# Patient Record
Sex: Female | Born: 1965
Health system: Southern US, Community
[De-identification: ages and names within clinical notes are randomized; demographics above are authoritative.]

## PROBLEM LIST (undated history)

## (undated) DIAGNOSIS — E7800 Pure hypercholesterolemia, unspecified: Secondary | ICD-10-CM

## (undated) DIAGNOSIS — I10 Essential (primary) hypertension: Secondary | ICD-10-CM

## (undated) DIAGNOSIS — E119 Type 2 diabetes mellitus without complications: Secondary | ICD-10-CM

## (undated) HISTORY — PX: TUBAL LIGATION: SHX77

## (undated) HISTORY — DX: Essential (primary) hypertension: I10

## (undated) HISTORY — PX: GALLBLADDER SURGERY: SHX652

---

## 1998-05-06 ENCOUNTER — Other Ambulatory Visit: Admission: RE | Admit: 1998-05-06 | Discharge: 1998-05-06 | Payer: Self-pay | Admitting: Obstetrics and Gynecology

## 1998-05-19 ENCOUNTER — Observation Stay (HOSPITAL_COMMUNITY): Admission: AD | Admit: 1998-05-19 | Discharge: 1998-05-20 | Payer: Self-pay | Admitting: Obstetrics and Gynecology

## 1998-06-20 ENCOUNTER — Encounter: Admission: RE | Admit: 1998-06-20 | Discharge: 1998-09-18 | Payer: Self-pay | Admitting: Obstetrics and Gynecology

## 1998-08-08 ENCOUNTER — Inpatient Hospital Stay (HOSPITAL_COMMUNITY): Admission: AD | Admit: 1998-08-08 | Discharge: 1998-08-08 | Payer: Self-pay | Admitting: Obstetrics

## 1998-08-28 ENCOUNTER — Inpatient Hospital Stay (HOSPITAL_COMMUNITY): Admission: AD | Admit: 1998-08-28 | Discharge: 1998-08-31 | Payer: Self-pay | Admitting: Obstetrics and Gynecology

## 1998-08-31 ENCOUNTER — Encounter (HOSPITAL_COMMUNITY): Admission: RE | Admit: 1998-08-31 | Discharge: 1998-11-29 | Payer: Self-pay | Admitting: Obstetrics and Gynecology

## 1998-12-03 ENCOUNTER — Encounter (HOSPITAL_COMMUNITY): Admission: RE | Admit: 1998-12-03 | Discharge: 1999-03-03 | Payer: Self-pay | Admitting: Obstetrics and Gynecology

## 1999-03-06 ENCOUNTER — Encounter (HOSPITAL_COMMUNITY): Admission: RE | Admit: 1999-03-06 | Discharge: 1999-06-04 | Payer: Self-pay | Admitting: Obstetrics and Gynecology

## 1999-04-30 ENCOUNTER — Encounter: Payer: Self-pay | Admitting: Emergency Medicine

## 1999-04-30 ENCOUNTER — Emergency Department (HOSPITAL_COMMUNITY): Admission: EM | Admit: 1999-04-30 | Discharge: 1999-05-01 | Payer: Self-pay | Admitting: Emergency Medicine

## 1999-06-06 ENCOUNTER — Encounter: Admission: RE | Admit: 1999-06-06 | Discharge: 1999-09-04 | Payer: Self-pay | Admitting: Obstetrics and Gynecology

## 1999-10-04 ENCOUNTER — Encounter: Admission: RE | Admit: 1999-10-04 | Discharge: 2000-01-02 | Payer: Self-pay | Admitting: Obstetrics and Gynecology

## 2000-01-04 ENCOUNTER — Encounter: Admission: RE | Admit: 2000-01-04 | Discharge: 2000-04-03 | Payer: Self-pay | Admitting: Obstetrics and Gynecology

## 2000-04-04 ENCOUNTER — Encounter: Admission: RE | Admit: 2000-04-04 | Discharge: 2000-05-05 | Payer: Self-pay | Admitting: Obstetrics and Gynecology

## 2014-10-03 ENCOUNTER — Other Ambulatory Visit: Payer: Self-pay | Admitting: Physical Medicine and Rehabilitation

## 2014-10-03 DIAGNOSIS — M5416 Radiculopathy, lumbar region: Secondary | ICD-10-CM

## 2014-10-09 ENCOUNTER — Ambulatory Visit
Admission: RE | Admit: 2014-10-09 | Discharge: 2014-10-09 | Disposition: A | Discharge status: Home / Self Care | Payer: No Typology Code available for payment source | Source: Ambulatory Visit | Attending: Physical Medicine and Rehabilitation | Admitting: Physical Medicine and Rehabilitation

## 2014-10-09 DIAGNOSIS — M2578 Osteophyte, vertebrae: Secondary | ICD-10-CM | POA: Diagnosis not present

## 2014-10-09 DIAGNOSIS — M79604 Pain in right leg: Secondary | ICD-10-CM | POA: Diagnosis present

## 2014-10-09 DIAGNOSIS — M47896 Other spondylosis, lumbar region: Secondary | ICD-10-CM | POA: Diagnosis not present

## 2014-10-09 DIAGNOSIS — M5136 Other intervertebral disc degeneration, lumbar region: Secondary | ICD-10-CM | POA: Insufficient documentation

## 2014-10-09 DIAGNOSIS — M5416 Radiculopathy, lumbar region: Secondary | ICD-10-CM

## 2014-10-09 DIAGNOSIS — M2548 Effusion, other site: Secondary | ICD-10-CM | POA: Insufficient documentation

## 2014-10-09 DIAGNOSIS — M79605 Pain in left leg: Secondary | ICD-10-CM | POA: Diagnosis present

## 2014-10-09 DIAGNOSIS — M545 Low back pain: Secondary | ICD-10-CM | POA: Diagnosis present

## 2014-11-04 DIAGNOSIS — M5116 Intervertebral disc disorders with radiculopathy, lumbar region: Secondary | ICD-10-CM | POA: Insufficient documentation

## 2014-11-04 DIAGNOSIS — M5136 Other intervertebral disc degeneration, lumbar region: Secondary | ICD-10-CM | POA: Insufficient documentation

## 2014-11-04 DIAGNOSIS — M7060 Trochanteric bursitis, unspecified hip: Secondary | ICD-10-CM | POA: Insufficient documentation

## 2014-11-07 ENCOUNTER — Encounter: Payer: Self-pay | Admitting: Family Medicine

## 2014-11-07 ENCOUNTER — Ambulatory Visit (INDEPENDENT_AMBULATORY_CARE_PROVIDER_SITE_OTHER): Payer: No Typology Code available for payment source | Admitting: Family Medicine

## 2014-11-07 VITALS — BP 110/76 | HR 112 | Resp 17 | Ht 63.0 in | Wt 249.6 lb

## 2014-11-07 DIAGNOSIS — E1122 Type 2 diabetes mellitus with diabetic chronic kidney disease: Secondary | ICD-10-CM | POA: Diagnosis not present

## 2014-11-07 DIAGNOSIS — F320 Major depressive disorder, single episode, mild: Secondary | ICD-10-CM | POA: Insufficient documentation

## 2014-11-07 DIAGNOSIS — G8929 Other chronic pain: Secondary | ICD-10-CM | POA: Insufficient documentation

## 2014-11-07 DIAGNOSIS — N924 Excessive bleeding in the premenopausal period: Secondary | ICD-10-CM

## 2014-11-07 DIAGNOSIS — N183 Chronic kidney disease, stage 3 unspecified: Secondary | ICD-10-CM | POA: Insufficient documentation

## 2014-11-07 DIAGNOSIS — I10 Essential (primary) hypertension: Secondary | ICD-10-CM | POA: Insufficient documentation

## 2014-11-07 DIAGNOSIS — E119 Type 2 diabetes mellitus without complications: Secondary | ICD-10-CM

## 2014-11-07 DIAGNOSIS — F329 Major depressive disorder, single episode, unspecified: Secondary | ICD-10-CM | POA: Diagnosis not present

## 2014-11-07 DIAGNOSIS — E1169 Type 2 diabetes mellitus with other specified complication: Secondary | ICD-10-CM | POA: Insufficient documentation

## 2014-11-07 DIAGNOSIS — M549 Dorsalgia, unspecified: Secondary | ICD-10-CM

## 2014-11-07 DIAGNOSIS — F32A Depression, unspecified: Secondary | ICD-10-CM | POA: Insufficient documentation

## 2014-11-07 MED ORDER — TRANEXAMIC ACID 650 MG PO TABS: 1300.0000 mg | ORAL_TABLET | Freq: Two times a day (BID) | ORAL | Status: DC

## 2014-11-07 NOTE — Assessment & Plan Note (Signed)
Check CMP. Pt cautioned to avoid NSAIDs. Maintain hydration. Consider titration off metformin if GFR < 45.

## 2014-11-07 NOTE — Progress Notes (Signed)
Subjective:    Patient ID: Barbara Bird, female    DOB: [birth year], 49 y.o.   MRN: [id number]  HPI: Barbara Bird is a 49 y.o. female presenting on 11/07/2014 for Establish Care and Back Pain   HPI  Pt presents to establish care today. Previous PCP was with [facility]. Sees Dr. Rich Brave with [facility] for back pain. Sees Dr. Levi Aland for orthopedics.  Current medical problems: HTN: Diagnosed >10 years ago. Currently Lisinopril-HCTZ; does not check BP at home. No HA, visual changes, or dizziness. Chronic stage 3: Never seen kidney specialist.  Type 2 DM: Diagnosed > 10 years ago. Last A1c- 8.9% in April. Does not check sugars at home. Taking Metformin  twice daily. Tries to stick with diabetic diet.  Degenerative spine disease: Orthopedics [facility] Spine specialist. In lots of pain. Started on gabapentin  BID- working well  Has been working at [employer] to help stretch her back. Was referred to pain specialist but cannot afford due to insurance costs.  Heavy/ long period: LMP start ~10/16/2014- heavy for 3-4 days: Normal periods are 6-7 days. This periods ~21 days- intermittently heavy but mostly spotting. Brownish drainage. No clots. Pt had tubal ligation in the past. Never had a period like this in the past. Unsure if Mom went through menopause.    Past Medical History  Diagnosis Date  . Hypertension   . Chronic kidney disease    History   Social History  . Marital Status: Married    Spouse Name: N/A  . Number of Children: N/A  . Years of Education: N/A   Occupational History  . Not on file.   Social History Main Topics  . Smoking status: Never Smoker   . Smokeless tobacco: Never Used  . Alcohol Use: Yes     Comment: occasional  . Drug Use: No  . Sexual Activity: Yes    Birth Control/ Protection: Surgical   Other Topics Concern  . Not on file   Social History Narrative  . No narrative on file   Family History  Problem Relation Age of Onset   . Stroke Father 33  . Diabetes Father   . Cancer Maternal Aunt     [employer]  . Mental illness Maternal Grandmother    No current outpatient prescriptions on file prior to visit.   No current facility-administered medications on file prior to visit.    Review of Systems  Constitutional: Negative for fever and chills.  HENT: Negative.   Respiratory: Negative for cough, chest tightness and wheezing.   Cardiovascular: Negative for chest pain, palpitations and leg swelling.  Gastrointestinal: Negative for nausea, diarrhea and abdominal distention.  Endocrine: Negative for cold intolerance, heat intolerance, polydipsia, polyphagia and polyuria.  Genitourinary: Positive for menstrual problem (increased length of menstrual period). Negative for vaginal bleeding, vaginal discharge, difficulty urinating, vaginal pain and dyspareunia.  Musculoskeletal: Positive for back pain and arthralgias.  Neurological: Positive for numbness.  Hematological: Negative.   Psychiatric/Behavioral: Positive for dysphoric mood. Negative for suicidal ideas.   Per HPI unless specifically indicated above     Depression screen [facility] 2/9 11/07/2014 11/07/2014  Decreased Interest 2 0  Down, Depressed, Hopeless 3 0  PHQ - 2 Score 5 0  Altered sleeping 2 -  Tired, decreased energy 1 -  Change in appetite 2 -  Feeling bad or failure about yourself  2 -  Trouble concentrating 0 -  Moving slowly or fidgety/restless 0 -  Suicidal thoughts  0 -  PHQ-9 Score 12 -  Difficult doing work/chores Very difficult -     Objective:    BP 110/76 mmHg  Pulse 112  Resp 17  Ht 5\' 3"  (1.6 m)  Wt 249 lb 9.6 oz (113.218 kg)  BMI 44.23 kg/m2  LMP 10/17/2014  Wt Readings from Last 3 Encounters:  11/07/14 249 lb 9.6 oz (113.218 kg)    Physical Exam  Constitutional: She is oriented to person, place, and time. She appears well-developed and well-nourished. No distress.  HENT:  Head: Normocephalic and atraumatic.  Neck: Neck  supple.  Cardiovascular: Normal rate, regular rhythm and normal heart sounds.  Exam reveals no gallop and no friction rub.   No murmur heard. Pulmonary/Chest: Effort normal and breath sounds normal. She has no wheezes. She exhibits no tenderness.  Abdominal: Soft. Normal appearance and bowel sounds are normal. She exhibits no distension and no mass. There is no tenderness. There is no rebound and no guarding.  Genitourinary: Vagina normal and uterus normal. There is no tenderness or lesion on the right labia. There is no tenderness or lesion on the left labia. Cervix exhibits discharge (Spotting noted on exam. Bloodtinged/brown discharge noted. ). Right adnexum displays no mass and no tenderness. Left adnexum displays no mass and no tenderness. No vaginal discharge found.  Musculoskeletal: Normal range of motion. She exhibits no edema or tenderness.  Lymphadenopathy:    She has no cervical adenopathy.  Neurological: She is alert and oriented to person, place, and time.  Skin: Skin is warm and dry. She is not diaphoretic.  Psychiatric: Her speech is normal. Judgment and thought content normal. She is agitated (tearful throughout interview. ). Cognition and memory are normal. She exhibits a depressed mood.   No results found for this or any previous visit.    Assessment & Plan:   Problem List Items Addressed This Visit      Cardiovascular and Mediastinum   BP (high blood pressure)    Controlled today. Check CMP. Continue current medication regimen.      Relevant Medications   lisinopril-hydrochlorothiazide (PRINZIDE,ZESTORETIC) 20-12.5 MG per tablet   tranexamic acid (LYSTEDA) 650 MG TABS tablet     Endocrine   Diabetes mellitus, type 2 - Primary    Pt declined POCT HgA1c. Check with lab work. Last A1c was 8.9% per [facility].  Continue current dosing of metformin. Consider adding additional oral medications pending A1c and kidney function.       Relevant Medications    lisinopril-hydrochlorothiazide (PRINZIDE,ZESTORETIC) 20-12.5 MG per tablet   metFORMIN (GLUCOPHAGE) 500 MG tablet   Other Relevant Orders   Comprehensive Metabolic Panel (CMET)   Lipid Profile   HgB A1c     Genitourinary   CKD stage 3 due to type 2 diabetes mellitus    Check CMP. Pt cautioned to avoid NSAIDs. Maintain hydration. Consider titration off metformin if GFR < 45.       Relevant Medications   lisinopril-hydrochlorothiazide (PRINZIDE,ZESTORETIC) 20-12.5 MG per tablet   metFORMIN (GLUCOPHAGE) 500 MG tablet     Other   Mild depression    PHQ-9 12/27. Pt attributed to pain. Discussed adding cymbalta. Pt would like to discuss with her spine specialist.        Other Visit Diagnoses    Excessive bleeding in premenopausal period        Check Hcg, CBC, and TSH. No mass or lesion on pelvic. Suspect perimenopausal AUB. Trial of  low dose lysteda. If continues referral to  GYN for endometrial biops    Relevant Medications    tranexamic acid (LYSTEDA) 650 MG TABS tablet    Other Relevant Orders    CBC with Differential    hCG, serum, qualitative    TSH       Meds ordered this encounter  Medications  . DISCONTD: carisoprodol (SOMA) 350 MG tablet    Sig:   . DISCONTD: cyclobenzaprine (FLEXERIL) 5 MG tablet    Sig:   . gabapentin (NEURONTIN) 300 MG capsule    Sig:   . lisinopril-hydrochlorothiazide (PRINZIDE,ZESTORETIC) 20-12.5 MG per tablet    Sig:   . DISCONTD: traMADol (ULTRAM) 50 MG tablet    Sig:   . DISCONTD: valACYclovir (VALTREX) 1000 MG tablet    Sig:   . Menthol, Topical Analgesic, 10 % LIQD    Sig:   . metFORMIN (GLUCOPHAGE) 500 MG tablet    Sig: Take by mouth.  . DISCONTD: carisoprodol (SOMA) 350 MG tablet    Sig: Take by mouth.  . DISCONTD: lisinopril-hydrochlorothiazide (PRINZIDE,ZESTORETIC) 20-12.5 MG per tablet    Sig: Take by mouth.  . DISCONTD: valACYclovir (VALTREX) 1000 MG tablet    Sig: Take by mouth.  . Carisoprodol-Aspirin (SOMA COMPOUND PO)     Sig: Take by mouth as needed.  . Nerve Stimulator (STANDARD TENS) DEVI    Sig: by Does not apply route.  . tranexamic acid (LYSTEDA) 650 MG TABS tablet    Sig: Take 2 tablets (1,300 mg total) by mouth 2 (two) times daily.    Dispense:  10 tablet    Refill:  0    Order Specific Question:  Supervising Provider    Answer:  Janeann Forehand [[id number]]      Follow up plan: Return in about 1 week (around 11/14/2014), or if symptoms worsen or fail to improve.

## 2014-11-07 NOTE — Assessment & Plan Note (Signed)
PHQ-9 12/27. Pt attributed to pain. Discussed adding cymbalta. Pt would like to discuss with her spine specialist.

## 2014-11-07 NOTE — Assessment & Plan Note (Signed)
Pt declined POCT HgA1c. Check with lab work. Last A1c was 8.9% per [facility].  Continue current dosing of metformin. Consider adding additional oral medications pending A1c and kidney function.

## 2014-11-07 NOTE — Assessment & Plan Note (Signed)
Controlled today. Check CMP. Continue current medication regimen.

## 2014-11-07 NOTE — Patient Instructions (Signed)
Abnormal Uterine Bleeding Abnormal uterine bleeding can affect women at various stages in life, including teenagers, women in their reproductive years, pregnant women, and women who have reached menopause. Several kinds of uterine bleeding are considered abnormal, including:  Bleeding or spotting between periods.   Bleeding after sexual intercourse.   Bleeding that is heavier or more than normal.   Periods that last longer than usual.  Bleeding after menopause.  Many cases of abnormal uterine bleeding are minor and simple to treat, while others are more serious. Any type of abnormal bleeding should be evaluated by your health care provider. Treatment will depend on the cause of the bleeding. HOME CARE INSTRUCTIONS Monitor your condition for any changes. The following actions may help to alleviate any discomfort you are experiencing:  Avoid the use of tampons and douches as directed by your health care provider.  Change your pads frequently. You should get regular pelvic exams and Pap tests. Keep all follow-up appointments for diagnostic tests as directed by your health care provider.  SEEK MEDICAL CARE IF:   Your bleeding lasts more than 1 week.   You feel dizzy at times.  SEEK IMMEDIATE MEDICAL CARE IF:   You pass out.   You are changing pads every 15 to 30 minutes.   You have abdominal pain.  You have a fever.   You become sweaty or weak.   You are passing large blood clots from the vagina.   You start to feel nauseous and vomit. MAKE SURE YOU:   Understand these instructions.  Will watch your condition.  Will get help right away if you are not doing well or get worse. Document Released: 03/29/2005 Document Revised: 04/03/2013 Document Reviewed: 10/26/2012 ExitCare Patient Information 2015 [employer]. This information is not intended to replace advice given to you by your health care provider. Make sure you discuss any questions you have with your  health care provider.  

## 2014-11-08 LAB — CBC WITH DIFFERENTIAL/PLATELET
Basophils Absolute: 0 10*3/uL (ref 0.0–0.2)
Basos: 0 %
EOS (ABSOLUTE): 0.2 10*3/uL (ref 0.0–0.4)
Eos: 1 %
Hematocrit: 40.5 % (ref 34.0–46.6)
Hemoglobin: 14.1 g/dL (ref 11.1–15.9)
IMMATURE GRANS (ABS): 0 10*3/uL (ref 0.0–0.1)
Immature Granulocytes: 0 %
Lymphocytes Absolute: 2.6 10*3/uL (ref 0.7–3.1)
Lymphs: 25 %
MCH: 31.3 pg (ref 26.6–33.0)
MCHC: 34.8 g/dL (ref 31.5–35.7)
MCV: 90 fL (ref 79–97)
Monocytes Absolute: 0.7 10*3/uL (ref 0.1–0.9)
Monocytes: 7 %
NEUTROS PCT: 67 %
Neutrophils Absolute: 7 10*3/uL (ref 1.4–7.0)
PLATELETS: 367 10*3/uL (ref 150–379)
RBC: 4.51 x10E6/uL (ref 3.77–5.28)
RDW: 13.8 % (ref 12.3–15.4)
WBC: 10.5 10*3/uL (ref 3.4–10.8)

## 2014-11-08 LAB — HEMOGLOBIN A1C
ESTIMATED AVERAGE GLUCOSE: 151 mg/dL
Hgb A1c MFr Bld: 6.9 % — ABNORMAL HIGH (ref 4.8–5.6)

## 2014-11-09 LAB — COMPREHENSIVE METABOLIC PANEL
ALT: 19 IU/L (ref 0–32)
AST: 12 IU/L (ref 0–40)
Albumin/Globulin Ratio: 2 (ref 1.1–2.5)
Albumin: 4.5 g/dL (ref 3.5–5.5)
Alkaline Phosphatase: 75 IU/L (ref 39–117)
BUN/Creatinine Ratio: 27 — ABNORMAL HIGH (ref 9–23)
BUN: 19 mg/dL (ref 6–24)
Bilirubin Total: 0.3 mg/dL (ref 0.0–1.2)
CALCIUM: 8.9 mg/dL (ref 8.7–10.2)
CO2: 18 mmol/L (ref 18–29)
Chloride: 101 mmol/L (ref 97–108)
Creatinine, Ser: 0.7 mg/dL (ref 0.57–1.00)
GFR calc Af Amer: 119 mL/min/{1.73_m2} (ref >59)
GFR calc non Af Amer: 104 mL/min/{1.73_m2} (ref >59)
GLOBULIN, TOTAL: 2.2 g/dL (ref 1.5–4.5)
Glucose: 160 mg/dL — ABNORMAL HIGH (ref 65–99)
Potassium: 4.6 mmol/L (ref 3.5–5.2)
SODIUM: 138 mmol/L (ref 134–144)
TOTAL PROTEIN: 6.7 g/dL (ref 6.0–8.5)

## 2014-11-09 LAB — LIPID PANEL
Chol/HDL Ratio: 6.3 ratio units — ABNORMAL HIGH (ref 0.0–4.4)
Cholesterol, Total: 214 mg/dL — ABNORMAL HIGH (ref 100–199)
HDL: 34 mg/dL — AB (ref >39)
LDL CALC: 116 mg/dL — AB (ref 0–99)
TRIGLYCERIDES: 318 mg/dL — AB (ref 0–149)
VLDL Cholesterol Cal: 64 mg/dL — ABNORMAL HIGH (ref 5–40)

## 2014-11-09 LAB — TSH: TSH: 1.79 u[IU]/mL (ref 0.450–4.500)

## 2014-11-09 LAB — HCG, SERUM, QUALITATIVE: HCG, BETA SUBUNIT, QUAL, SERUM: NEGATIVE m[IU]/mL (ref <6)

## 2014-11-11 ENCOUNTER — Telehealth: Payer: Self-pay | Admitting: Family Medicine

## 2014-11-11 MED ORDER — ATORVASTATIN CALCIUM 20 MG PO TABS: 20.0000 mg | ORAL_TABLET | Freq: Every day | ORAL | Status: DC

## 2014-11-11 NOTE — Telephone Encounter (Signed)
Reviewed lab result with patient. Her kidney function is normal at this time. A1c down from 8.9 to 6.9%- keep up metformin twice daily. Discussed need for statin- we will start lipitor  and recheck liver enzymes and cholesterol in 3 mos.

## 2015-02-24 ENCOUNTER — Ambulatory Visit (INDEPENDENT_AMBULATORY_CARE_PROVIDER_SITE_OTHER): Payer: No Typology Code available for payment source | Admitting: Family Medicine

## 2015-02-24 ENCOUNTER — Encounter: Payer: Self-pay | Admitting: Family Medicine

## 2015-02-24 VITALS — BP 129/85 | HR 97 | Temp 98.2°F | Resp 16 | Ht 63.0 in | Wt 250.0 lb

## 2015-02-24 DIAGNOSIS — E785 Hyperlipidemia, unspecified: Secondary | ICD-10-CM

## 2015-02-24 DIAGNOSIS — M5136 Other intervertebral disc degeneration, lumbar region: Secondary | ICD-10-CM | POA: Diagnosis not present

## 2015-02-24 DIAGNOSIS — Z6841 Body Mass Index (BMI) 40.0 and over, adult: Secondary | ICD-10-CM

## 2015-02-24 DIAGNOSIS — N183 Chronic kidney disease, stage 3 unspecified: Secondary | ICD-10-CM

## 2015-02-24 DIAGNOSIS — I129 Hypertensive chronic kidney disease with stage 1 through stage 4 chronic kidney disease, or unspecified chronic kidney disease: Secondary | ICD-10-CM

## 2015-02-24 DIAGNOSIS — E1169 Type 2 diabetes mellitus with other specified complication: Secondary | ICD-10-CM | POA: Diagnosis not present

## 2015-02-24 DIAGNOSIS — E1122 Type 2 diabetes mellitus with diabetic chronic kidney disease: Secondary | ICD-10-CM

## 2015-02-24 MED ORDER — GABAPENTIN 300 MG PO CAPS: 300.0000 mg | ORAL_CAPSULE | Freq: Three times a day (TID) | ORAL | Status: DC

## 2015-02-24 MED ORDER — METFORMIN HCL 500 MG PO TABS: 500.0000 mg | ORAL_TABLET | Freq: Two times a day (BID) | ORAL | Status: DC

## 2015-02-24 MED ORDER — ATORVASTATIN CALCIUM 20 MG PO TABS: 20.0000 mg | ORAL_TABLET | Freq: Every day | ORAL | Status: DC

## 2015-02-24 MED ORDER — LISINOPRIL-HYDROCHLOROTHIAZIDE 20-12.5 MG PO TABS: 1.0000 | ORAL_TABLET | Freq: Every day | ORAL | Status: DC

## 2015-02-24 NOTE — Assessment & Plan Note (Signed)
Recheck lipid panel to monitor therapy. Check LFTs to monitor therapy. Heart healthy diet and lifestyle changes encouraged. Continue atorvastatin.

## 2015-02-24 NOTE — Progress Notes (Signed)
Subjective:    Patient ID: Barbara ShellingChristina Bird, female    DOB: Jul 08, 1965, 49 y.o.   MRN: [id number]  HPI: Barbara Bird is a 49 y.o. female presenting on 02/24/2015 for Diabetes   Diabetes She presents for her follow-up diabetic visit. She has type 2 diabetes mellitus. Her disease course has been improving. There are no hypoglycemic associated symptoms. Pertinent negatives for hypoglycemia include no dizziness or headaches. Pertinent negatives for diabetes include no chest pain, no foot paresthesias, no polydipsia, no polyphagia, no polyuria and no visual change. There are no hypoglycemic complications. Risk factors for coronary artery disease include dyslipidemia and diabetes mellitus. Current diabetic treatment includes oral agent (monotherapy). An ACE inhibitor/angiotensin II receptor blocker is being taken. She does not see a podiatrist.Eye exam is not current.  Hypertension This is a chronic problem. The problem is controlled. Pertinent negatives include no chest pain, headaches, palpitations, peripheral edema or shortness of breath. Risk factors for coronary artery disease include diabetes mellitus and dyslipidemia. The current treatment provides moderate improvement. Hypertensive end-organ damage includes kidney disease. Identifiable causes of hypertension include chronic renal disease.  Hyperlipidemia This is a chronic problem. Recent lipid tests were reviewed and are high. Exacerbating diseases include chronic renal disease, diabetes and obesity. Pertinent negatives include no chest pain or shortness of breath. Current antihyperlipidemic treatment includes statins. Risk factors for coronary artery disease include diabetes mellitus and dyslipidemia.   Pt requesting renewal of gabapentin for her degenerative disk disease.  Declines flu shot today. Declines diabetic eye exam  Past Medical History  Diagnosis Date  . Hypertension   . Chronic kidney disease     Current Outpatient  Prescriptions on File Prior to Visit  Medication Sig  . Carisoprodol-Aspirin (SOMA COMPOUND PO) Take by mouth as needed.  . Menthol, Topical Analgesic, 10 % LIQD   . Nerve Stimulator (STANDARD TENS) DEVI by Does not apply route.   No current facility-administered medications on file prior to visit.    Review of Systems  Constitutional: Negative for fever and chills.  HENT: Negative.   Respiratory: Negative for shortness of breath.   Cardiovascular: Negative for chest pain, palpitations and leg swelling.  Gastrointestinal: Negative for abdominal pain and abdominal distention.  Endocrine: Negative for cold intolerance, heat intolerance, polydipsia, polyphagia and polyuria.  Genitourinary: Negative.   Musculoskeletal: Negative.   Neurological: Negative for dizziness, numbness and headaches.  Psychiatric/Behavioral: Negative.    Per HPI unless specifically indicated above     Objective:    BP 129/85 mmHg  Pulse 97  Temp(Src) 98.2 F (36.8 C) (Oral)  Resp 16  Ht 5\' 3"  (1.6 m)  Wt 250 lb (113.399 kg)  BMI 44.30 kg/m2  Wt Readings from Last 3 Encounters:  02/24/15 250 lb (113.399 kg)  11/07/14 249 lb 9.6 oz (113.218 kg)    Physical Exam  Constitutional: She is oriented to person, place, and time. She appears well-developed and well-nourished. No distress.  Neck: Normal range of motion. Neck supple. No thyromegaly present.  Cardiovascular: Normal rate and regular rhythm.  Exam reveals no gallop and no friction rub.   No murmur heard. Pulmonary/Chest: Effort normal and breath sounds normal.  Abdominal: Soft. Bowel sounds are normal. There is no tenderness. There is no rebound.  Musculoskeletal: Normal range of motion. She exhibits no edema or tenderness.  Lymphadenopathy:    She has no cervical adenopathy.  Neurological: She is alert and oriented to person, place, and time.  Skin: Skin is warm and dry. She  is not diaphoretic.   Diabetic Foot Exam - Simple   Simple Foot Form   Diabetic Foot exam was performed with the following findings:  Yes 02/24/2015  2:52 PM  Visual Inspection  No deformities, no ulcerations, no other skin breakdown bilaterally:  Yes  Sensation Testing  Intact to touch and monofilament testing bilaterally:  Yes  Pulse Check  Posterior Tibialis and Dorsalis pulse intact bilaterally:  Yes  Comments      Results for orders placed or performed in visit on 11/07/14  Comprehensive Metabolic Panel (CMET)  Result Value Ref Range   Glucose 160 (H) 65 - 99 mg/dL   BUN 19 6 - 24 mg/dL   Creatinine, Ser 2.13 0.57 - 1.00 mg/dL   GFR calc non Af Amer 104 >59 mL/min/1.73   GFR calc Af Amer 119 >59 mL/min/1.73   BUN/Creatinine Ratio 27 (H) 9 - 23   Sodium 138 134 - 144 mmol/L   Potassium 4.6 3.5 - 5.2 mmol/L   Chloride 101 97 - 108 mmol/L   CO2 18 18 - 29 mmol/L   Calcium 8.9 8.7 - 10.2 mg/dL   Total Protein 6.7 6.0 - 8.5 g/dL   Albumin 4.5 3.5 - 5.5 g/dL   Globulin, Total 2.2 1.5 - 4.5 g/dL   Albumin/Globulin Ratio 2.0 1.1 - 2.5   Bilirubin Total 0.3 0.0 - 1.2 mg/dL   Alkaline Phosphatase 75 39 - 117 IU/L   AST 12 0 - 40 IU/L   ALT 19 0 - 32 IU/L  Lipid Profile  Result Value Ref Range   Cholesterol, Total 214 (H) 100 - 199 mg/dL   Triglycerides 086 (H) 0 - 149 mg/dL   HDL 34 (L) >57 mg/dL   VLDL Cholesterol Cal 64 (H) 5 - 40 mg/dL   LDL Calculated 846 (H) 0 - 99 mg/dL   Chol/HDL Ratio 6.3 (H) 0.0 - 4.4 ratio units  CBC with Differential  Result Value Ref Range   WBC 10.5 3.4 - 10.8 x10E3/uL   RBC 4.51 3.77 - 5.28 x10E6/uL   Hemoglobin 14.1 11.1 - 15.9 g/dL   Hematocrit 96.2 95.2 - 46.6 %   MCV 90 79 - 97 fL   MCH 31.3 26.6 - 33.0 pg   MCHC 34.8 31.5 - 35.7 g/dL   RDW 84.1 32.4 - 40.1 %   Platelets 367 150 - 379 x10E3/uL   Neutrophils 67 %   Lymphs 25 %   Monocytes 7 %   Eos 1 %   Basos 0 %   Neutrophils Absolute 7.0 1.4 - 7.0 x10E3/uL   Lymphocytes Absolute 2.6 0.7 - 3.1 x10E3/uL   Monocytes Absolute 0.7 0.1 - 0.9 x10E3/uL    EOS (ABSOLUTE) 0.2 0.0 - 0.4 x10E3/uL   Basophils Absolute 0.0 0.0 - 0.2 x10E3/uL   Immature Granulocytes 0 %   Immature Grans (Abs) 0.0 0.0 - 0.1 x10E3/uL  hCG, serum, qualitative  Result Value Ref Range   hCG,Beta Subunit,Qual,Serum Negative Negative <6 mIU/mL  HgB A1c  Result Value Ref Range   Hgb A1c MFr Bld 6.9 (H) 4.8 - 5.6 %   Est. average glucose Bld gHb Est-mCnc 151 mg/dL  TSH  Result Value Ref Range   TSH 1.790 0.450 - 4.500 uIU/mL      Assessment & Plan:   Problem List Items Addressed This Visit      Cardiovascular and Mediastinum   BP (high blood pressure)    Controlled. Continue current medications. CMP today.  Dash diet  reviewed. Alarm symptoms reviewed.       Relevant Medications   lisinopril-hydrochlorothiazide (PRINZIDE,ZESTORETIC) 20-12.5 MG tablet   atorvastatin (LIPITOR) 20 MG tablet     Endocrine   Diabetes mellitus, type 2 (HCC)    Last A1c 6.9%. Continue metformin. Check A1c today.  Foot exam done. Pt declining eye exam this year due to cost. Education provided. Microalbumin ordered. ACE for renal protection.       Relevant Medications   metFORMIN (GLUCOPHAGE) 500 MG tablet   lisinopril-hydrochlorothiazide (PRINZIDE,ZESTORETIC) 20-12.5 MG tablet   atorvastatin (LIPITOR) 20 MG tablet   Other Relevant Orders   Lipid Profile   HgB A1c   Urine Microalbumin w/creat. ratio     Musculoskeletal and Integument   DDD (degenerative disc disease), lumbar    Current being managed by ortho. Pt requesting renewal of gabapentin today. Temporary handicap license given due to inability to walk long distances.       Relevant Medications   carisoprodol (SOMA) 350 MG tablet   gabapentin (NEURONTIN) 300 MG capsule     Genitourinary   CKD stage 3 due to type 2 diabetes mellitus (HCC) - Primary    Check CMP to monitor renal function. ACE for renal protection. Advised to avoid NSAIDs.       Relevant Medications   metFORMIN (GLUCOPHAGE) 500 MG tablet    lisinopril-hydrochlorothiazide (PRINZIDE,ZESTORETIC) 20-12.5 MG tablet   atorvastatin (LIPITOR) 20 MG tablet   Other Relevant Orders   Comprehensive Metabolic Panel (CMET)     Other   Body mass index (BMI) of 45.0-49.9 in adult [facility])    Pt currently exercising. Diet changes recommended. Offered referral to lifestyles center, pt declined due to cost.       Relevant Medications   metFORMIN (GLUCOPHAGE) 500 MG tablet   Hyperlipidemia associated with type 2 diabetes mellitus (HCC)    Recheck lipid panel to monitor therapy. Check LFTs to monitor therapy. Heart healthy diet and lifestyle changes encouraged. Continue atorvastatin.       Relevant Medications   metFORMIN (GLUCOPHAGE) 500 MG tablet   lisinopril-hydrochlorothiazide (PRINZIDE,ZESTORETIC) 20-12.5 MG tablet   atorvastatin (LIPITOR) 20 MG tablet      Meds ordered this encounter  Medications  . carisoprodol (SOMA) 350 MG tablet    Sig: 1 po bid prn  . metFORMIN (GLUCOPHAGE) 500 MG tablet    Sig: Take 1 tablet (500 mg total) by mouth 2 (two) times daily with a meal.    Dispense:  60 tablet    Refill:  11    Order Specific Question:  Supervising Provider    Answer:  Janeann Forehand [phone]  . lisinopril-hydrochlorothiazide (PRINZIDE,ZESTORETIC) 20-12.5 MG tablet    Sig: Take 1 tablet by mouth daily.    Dispense:  30 tablet    Refill:  11    Order Specific Question:  Supervising Provider    Answer:  Janeann Forehand [phone]  . atorvastatin (LIPITOR) 20 MG tablet    Sig: Take 1 tablet (20 mg total) by mouth daily.    Dispense:  30 tablet    Refill:  11    Order Specific Question:  Supervising Provider    Answer:  Janeann Forehand [phone]  . gabapentin (NEURONTIN) 300 MG capsule    Sig: Take 1 capsule (300 mg total) by mouth 3 (three) times daily.    Dispense:  270 capsule    Refill:  3    Order Specific Question:  Supervising Provider  Answer:  Janeann Forehand [[id number]]      Follow up  plan: Return in about 3 months (around 05/27/2015) for HTN, diabetes, hyperlipidemia.

## 2015-02-24 NOTE — Assessment & Plan Note (Signed)
Check CMP to monitor renal function. ACE for renal protection. Advised to avoid NSAIDs.

## 2015-02-24 NOTE — Patient Instructions (Signed)
Please get your labs done. We will change your medications as needed. Please let me know if you want to switch your spine doctor, I can help you with this referral.  Your goal blood pressure is 140/90. Work on low salt/sodium diet - goal <2.5gm (2,500mg ) per day. Eat a diet high in fruits/vegetables and whole grains.  Look into mediterranean and DASH diet. Goal activity is 14350min/wk of moderate intensity exercise.  This can be split into 30 minute chunks.  If you are not at this level, you can start with smaller 10-15 min increments and slowly build up activity. Look at www.heart.org for more resources  Please seek immediate medical attention at ER or Urgent Care if you develop: Chest pain, pressure or tightness. Shortness of breath accompanied by nausea or diaphoresis Visual changes Numbness or tingling on one side of the body Facial droop Altered mental status Or any concerning symptoms.

## 2015-02-24 NOTE — Assessment & Plan Note (Signed)
Controlled. Continue current medications. CMP today.  Dash diet reviewed. Alarm symptoms reviewed.

## 2015-02-24 NOTE — Assessment & Plan Note (Signed)
Last A1c 6.9%. Continue metformin. Check A1c today.  Foot exam done. Pt declining eye exam this year due to cost. Education provided. Microalbumin ordered. ACE for renal protection.

## 2015-02-24 NOTE — Assessment & Plan Note (Signed)
Pt currently exercising. Diet changes recommended. Offered referral to lifestyles center, pt declined due to cost.

## 2015-02-24 NOTE — Assessment & Plan Note (Signed)
Current being managed by ortho. Pt requesting renewal of gabapentin today. Temporary handicap license given due to inability to walk long distances.

## 2015-04-02 LAB — HEMOGLOBIN A1C
ESTIMATED AVERAGE GLUCOSE: 160 mg/dL
HEMOGLOBIN A1C: 7.2 % — AB (ref 4.8–5.6)

## 2015-04-02 LAB — MICROALBUMIN / CREATININE URINE RATIO
Creatinine, Urine: 164.6 mg/dL
MICROALB/CREAT RATIO: 7.7 mg/g creat (ref 0.0–30.0)
MICROALBUM., U, RANDOM: 12.6 ug/mL

## 2015-04-03 ENCOUNTER — Other Ambulatory Visit: Payer: Self-pay | Admitting: Family Medicine

## 2015-04-03 DIAGNOSIS — E785 Hyperlipidemia, unspecified: Secondary | ICD-10-CM

## 2015-04-03 DIAGNOSIS — E1169 Type 2 diabetes mellitus with other specified complication: Secondary | ICD-10-CM

## 2015-04-03 DIAGNOSIS — N183 Chronic kidney disease, stage 3 (moderate): Principal | ICD-10-CM

## 2015-04-03 DIAGNOSIS — E1122 Type 2 diabetes mellitus with diabetic chronic kidney disease: Secondary | ICD-10-CM

## 2015-04-03 LAB — COMPREHENSIVE METABOLIC PANEL
A/G RATIO: 2.1 (ref 1.1–2.5)
ALBUMIN: 4.2 g/dL (ref 3.5–5.5)
ALK PHOS: 71 IU/L (ref 39–117)
ALT: 16 IU/L (ref 0–32)
AST: 8 IU/L (ref 0–40)
BILIRUBIN TOTAL: 0.3 mg/dL (ref 0.0–1.2)
BUN/Creatinine Ratio: 13 (ref 9–23)
BUN: 8 mg/dL (ref 6–24)
CO2: 20 mmol/L (ref 18–29)
CREATININE: 0.62 mg/dL (ref 0.57–1.00)
Calcium: 8.5 mg/dL — ABNORMAL LOW (ref 8.7–10.2)
Chloride: 104 mmol/L (ref 96–106)
GFR, EST AFRICAN AMERICAN: 123 mL/min/{1.73_m2} (ref >59)
GFR, EST NON AFRICAN AMERICAN: 107 mL/min/{1.73_m2} (ref >59)
GLUCOSE: 159 mg/dL — AB (ref 65–99)
Globulin, Total: 2 g/dL (ref 1.5–4.5)
POTASSIUM: 4 mmol/L (ref 3.5–5.2)
Sodium: 140 mmol/L (ref 134–144)
Total Protein: 6.2 g/dL (ref 6.0–8.5)

## 2015-04-03 LAB — LIPID PANEL
CHOL/HDL RATIO: 3 ratio (ref 0.0–4.4)
Cholesterol, Total: 97 mg/dL — ABNORMAL LOW (ref 100–199)
HDL: 32 mg/dL — AB (ref >39)
LDL CALC: 33 mg/dL (ref 0–99)
TRIGLYCERIDES: 162 mg/dL — AB (ref 0–149)
VLDL Cholesterol Cal: 32 mg/dL (ref 5–40)

## 2015-04-03 MED ORDER — GLIMEPIRIDE 1 MG PO TABS: 1.0000 mg | ORAL_TABLET | Freq: Every day | ORAL | Status: DC

## 2015-04-03 MED ORDER — ATORVASTATIN CALCIUM 10 MG PO TABS: 10.0000 mg | ORAL_TABLET | Freq: Every day | ORAL | Status: DC

## 2015-05-27 ENCOUNTER — Ambulatory Visit (INDEPENDENT_AMBULATORY_CARE_PROVIDER_SITE_OTHER): Payer: BLUE CROSS/BLUE SHIELD | Admitting: Family Medicine

## 2015-05-27 VITALS — BP 115/82 | HR 108 | Temp 98.2°F | Resp 16 | Ht 63.0 in | Wt 248.0 lb

## 2015-05-27 DIAGNOSIS — N183 Chronic kidney disease, stage 3 (moderate): Secondary | ICD-10-CM

## 2015-05-27 DIAGNOSIS — I10 Essential (primary) hypertension: Secondary | ICD-10-CM

## 2015-05-27 DIAGNOSIS — Z6841 Body Mass Index (BMI) 40.0 and over, adult: Secondary | ICD-10-CM | POA: Diagnosis not present

## 2015-05-27 DIAGNOSIS — E1122 Type 2 diabetes mellitus with diabetic chronic kidney disease: Secondary | ICD-10-CM

## 2015-05-27 MED ORDER — CANAGLIFLOZIN 100 MG PO TABS: 100.0000 mg | ORAL_TABLET | Freq: Every day | ORAL | Status: DC

## 2015-05-27 NOTE — Progress Notes (Signed)
Subjective:    Patient ID: Barbara Bird, female    DOB: June 01, 1965, 50 y.o.   MRN: [id number]  HPI: Barbara Bird is a 50 y.o. female presenting on 05/27/2015 for Diabetes   HPI  Diabetes: Last A1c 7.2%-added glimepiride for increased A1c. Pt was afraid to take it due to another provider telling her it was bad. She misunderstood lab directions at last visit and thought we were telling her to stop metformin. Taking metformin twice daily. Some numbness and tingling in her feet.  She is requesting cortisol levels checked due to her obesity. She was told high cortisol caused her obesity.  Hypertension is doing well. No chest pain, shortness of breath.      Past Medical History  Diagnosis Date  . Hypertension   . Chronic kidney disease     Current Outpatient Prescriptions on File Prior to Visit  Medication Sig  . atorvastatin (LIPITOR) 10 MG tablet Take 1 tablet (10 mg total) by mouth daily.  . carisoprodol (SOMA) 350 MG tablet 1 po bid prn  . Carisoprodol-Aspirin (SOMA COMPOUND PO) Take by mouth as needed.  . gabapentin (NEURONTIN) 300 MG capsule Take 1 capsule (300 mg total) by mouth 3 (three) times daily.  Marland Kitchen lisinopril-hydrochlorothiazide (PRINZIDE,ZESTORETIC) 20-12.5 MG tablet Take 1 tablet by mouth daily.  . Menthol, Topical Analgesic, 10 % LIQD   . metFORMIN (GLUCOPHAGE) 500 MG tablet Take 1 tablet (500 mg total) by mouth 2 (two) times daily with a meal.  . Nerve Stimulator (STANDARD TENS) DEVI by Does not apply route.   No current facility-administered medications on file prior to visit.    Review of Systems  Constitutional: Negative for fever and chills.  HENT: Negative.   Respiratory: Negative for cough, chest tightness and wheezing.   Cardiovascular: Negative for chest pain and leg swelling.  Gastrointestinal: Negative for nausea, vomiting, abdominal pain, diarrhea and constipation.  Endocrine: Negative.  Negative for cold intolerance, heat intolerance,  polydipsia, polyphagia and polyuria.  Genitourinary: Negative for dysuria and difficulty urinating.  Musculoskeletal: Negative.   Neurological: Positive for numbness. Negative for dizziness and light-headedness.  Psychiatric/Behavioral: Negative.    Per HPI unless specifically indicated above     Objective:    BP 115/82 mmHg  Pulse 108  Temp(Src) 98.2 F (36.8 C) (Oral)  Resp 16  Ht  (1.6 m)  Wt 248 lb (112.492 kg)  BMI 43.94 kg/m2  Wt Readings from Last 3 Encounters:  05/27/15 248 lb (112.492 kg)  02/24/15 250 lb (113.399 kg)  11/07/14 249 lb 9.6 oz (113.218 kg)    Physical Exam  Constitutional: She is oriented to person, place, and time. She appears well-developed and well-nourished.  HENT:  Head: Normocephalic and atraumatic.  Neck: Neck supple.  Cardiovascular: Normal rate, regular rhythm and normal heart sounds.  Exam reveals no gallop and no friction rub.   No murmur heard. Pulmonary/Chest: Effort normal and breath sounds normal. She has no wheezes. She exhibits no tenderness.  Abdominal: Soft. Normal appearance and bowel sounds are normal. She exhibits no distension and no mass. There is no tenderness. There is no rebound and no guarding.  Musculoskeletal: Normal range of motion. She exhibits no edema or tenderness.  Lymphadenopathy:    She has no cervical adenopathy.  Neurological: She is alert and oriented to person, place, and time.  Skin: Skin is warm and dry.   Results for orders placed or performed in visit on 02/24/15  Comprehensive Metabolic Panel (CMET)  Result Value  Ref Range   Glucose 159 (H) 65 - 99 mg/dL   BUN 8 6 - 24 mg/dL   Creatinine, Ser 5.40 0.57 - 1.00 mg/dL   GFR calc non Af Amer 107 >59 mL/min/1.73   GFR calc Af Amer 123 >59 mL/min/1.73   BUN/Creatinine Ratio 13 9 - 23   Sodium 140 134 - 144 mmol/L   Potassium 4.0 3.5 - 5.2 mmol/L   Chloride 104 96 - 106 mmol/L   CO2 20 18 - 29 mmol/L   Calcium 8.5 (L) 8.7 - 10.2 mg/dL   Total  Protein 6.2 6.0 - 8.5 g/dL   Albumin 4.2 3.5 - 5.5 g/dL   Globulin, Total 2.0 1.5 - 4.5 g/dL   Albumin/Globulin Ratio 2.1 1.1 - 2.5   Bilirubin Total 0.3 0.0 - 1.2 mg/dL   Alkaline Phosphatase 71 39 - 117 IU/L   AST 8 0 - 40 IU/L   ALT 16 0 - 32 IU/L  Lipid Profile  Result Value Ref Range   Cholesterol, Total 97 (L) 100 - 199 mg/dL   Triglycerides 981 (H) 0 - 149 mg/dL   HDL 32 (L) >19 mg/dL   VLDL Cholesterol Cal 32 5 - 40 mg/dL   LDL Calculated 33 0 - 99 mg/dL   Chol/HDL Ratio 3.0 0.0 - 4.4 ratio units  HgB A1c  Result Value Ref Range   Hgb A1c MFr Bld 7.2 (H) 4.8 - 5.6 %   Est. average glucose Bld gHb Est-mCnc 160 mg/dL  Urine Microalbumin w/creat. ratio  Result Value Ref Range   Creatinine, Urine 164.6 Not Estab. mg/dL   Microalbum.,U,Random 12.6 Not Estab. ug/mL   MICROALB/CREAT RATIO 7.7 0.0 - 30.0 mg/g creat      Assessment & Plan:   Problem List Items Addressed This Visit      Cardiovascular and Mediastinum   BP (high blood pressure)    Controlled watch with invokana. Pt will call with low BP.        Endocrine   Diabetes mellitus, type 2 (HCC) - Primary    Pt would like to start invokana to help control her diabetes better. Start  once daily. Reviewed side effects and benefits. Co-pay card given. Pt will watch blood pressure carefully. Recheck A1c on march 22.       Relevant Medications   canagliflozin (INVOKANA) 100 MG TABS tablet   Other Relevant Orders   Hemoglobin A1c   Basic Metabolic Panel (BMET)     Other   Body mass index (BMI) of 45.0-49.9 in adult [facility])    Discussed benefits of checking cortisol. I do not see the benefits in checking this lab and am not sure it would be helpful. I have recommended referral to [facility].  Am willing to do some research and we will discuss at later visit.       Relevant Medications   canagliflozin (INVOKANA) 100 MG TABS tablet      Meds ordered this encounter  Medications  . canagliflozin  (INVOKANA) 100 MG TABS tablet    Sig: Take 1 tablet (100 mg total) by mouth daily before breakfast.    Dispense:  30 tablet    Refill:  11    Order Specific Question:  Supervising Provider    Answer:  Janeann Forehand [phone]      Follow up plan: No Follow-up on file.

## 2015-05-27 NOTE — Assessment & Plan Note (Addendum)
Discussed benefits of checking cortisol. I do not see the benefits in checking this lab and am not sure it would be helpful. I have recommended referral to [facility].  Am willing to do some research and we will discuss at later visit.

## 2015-05-27 NOTE — Assessment & Plan Note (Signed)
Controlled watch with invokana. Pt will call with low BP.

## 2015-05-27 NOTE — Assessment & Plan Note (Signed)
Pt would like to start invokana to help control her diabetes better. Start  once daily. Reviewed side effects and benefits. Co-pay card given. Pt will watch blood pressure carefully. Recheck A1c on march 22.

## 2015-05-27 NOTE — Patient Instructions (Signed)
Canagliflozin oral tablets What is this medicine? CANAGLIFLOZIN (KAN a gli FLOE zin) helps to treat type 2 diabetes. It helps to control blood sugar. Treatment is combined with diet and exercise. This medicine may be used for other purposes; ask your health care provider or pharmacist if you have questions. What should I tell my health care provider before I take this medicine? They need to know if you have any of these conditions: -dehydration -diabetic ketoacidosis -diet low in salt -eating less due to illness, surgery, dieting, or any other reason -having surgery -high cholesterol -high levels of potassium in the blood -history of pancreatitis or pancreas problems -history of yeast infection of the penis or vagina -if you often drink alcohol -infections in the bladder, kidneys, or urinary tract -kidney disease -liver disease -low blood pressure -on hemodialysis -problems urinating -type 1 diabetes -uncircumcised female -an unusual or allergic reaction to canagliflozin, other medicines, foods, dyes, or preservatives -pregnant or trying to get pregnant -breast-feeding How should I use this medicine? Take this medicine by mouth with a glass of water. Follow the directions on the prescription label. Take it before the first meal of the day. Take your dose at the same time each day. Do not take more often than directed. Do not stop taking except on your doctor's advice. A special MedGuide will be given to you by the pharmacist with each prescription and refill. Be sure to read this information carefully each time. Talk to your pediatrician regarding the use of this medicine in children. Special care may be needed. Overdosage: If you think you have taken too much of this medicine contact a poison control center or emergency room at once. NOTE: This medicine is only for you. Do not share this medicine with others. What if I miss a dose? If you miss a dose, take it as soon as you can. If  it is almost time for your next dose, take only that dose. Do not take double or extra doses. What may interact with this medicine? Do not take this medicine with any of the following medications: -gatifloxacinThis medicine may also interact with the following medications: -alcohol -certain medicines for blood pressure, heart disease -digoxin -diuretics -insulin -nateglinide -phenobarbital -phenytoin -repaglinide -rifampin -ritonavir -sulfonylureas like glimepiride, glipizide, glyburide This list may not describe all possible interactions. Give your health care provider a list of all the medicines, herbs, non-prescription drugs, or dietary supplements you use. Also tell them if you smoke, drink alcohol, or use illegal drugs. Some items may interact with your medicine. What should I watch for while using this medicine? Visit your doctor or health care professional for regular checks on your progress. This medicine can cause a serious condition in which there is too much acid in the blood. If you develop nausea, vomiting, stomach pain, unusual tiredness, or breathing problems, stop taking this medicine and call your doctor right away. If possible, use a ketone dipstick to check for ketones in your urine. A test called the HbA1C (A1C) will be monitored. This is a simple blood test. It measures your blood sugar control over the last 2 to 3 months. You will receive this test every 3 to 6 months. Learn how to check your blood sugar. Learn the symptoms of low and high blood sugar and how to manage them. Always carry a quick-source of sugar with you in case you have symptoms of low blood sugar. Examples include hard sugar candy or glucose tablets. Make sure others know that you   can choke if you eat or drink when you develop serious symptoms of low blood sugar, such as seizures or unconsciousness. They must get medical help at once. Tell your doctor or health care professional if you have high blood  sugar. You might need to change the dose of your medicine. If you are sick or exercising more than usual, you might need to change the dose of your medicine. Do not skip meals. Ask your doctor or health care professional if you should avoid alcohol. Many nonprescription cough and cold products contain sugar or alcohol. These can affect blood sugar. Wear a medical ID bracelet or chain, and carry a card that describes your disease and details of your medicine and dosage times. What side effects may I notice from receiving this medicine? Side effects that you should report to your doctor or health care professional as soon as possible: -allergic reactions like skin rash, itching or hives, swelling of the face, lips, or tongue -breathing problems -chest pain -dizziness -fast or irregular heartbeat -feeling faint or lightheaded, falls -muscle weakness -nausea, vomiting, unusual stomach upset or pain -new pain or tenderness, change in skin color, sores or ulcers, or infection in legs or feet -signs and symptoms of low blood sugar such as feeling anxious, confusion, dizziness, increased hunger, unusually weak or tired, sweating, shakiness, cold, irritable, headache, blurred vision, fast heartbeat, loss of consciousness -signs and symptoms of a urinary tract infection, such as fever, chills, a burning feeling when urinating, blood in the urine, back pain -trouble passing urine or change in the amount of urine, including an urgent need to urinate more often, in larger amounts, or at night -penile discharge, itching, or pain in men -unusual tiredness -vaginal discharge, itching, or odor in women Side effects that usually do not require medical attention (Report these to your doctor or health care professional if they continue or are bothersome.): -constipation -mild increase in urination -thirsty This list may not describe all possible side effects. Call your doctor for medical advice about side  effects. You may report side effects to FDA at 1-800-FDA-1088. Where should I keep my medicine? Keep out of the reach of children. Store at room temperature between 20 and 25 degrees C (68 and 77 degrees F). Throw away any unused medicine after the expiration date. NOTE: This sheet is a summary. It may not cover all possible information. If you have questions about this medicine, talk to your doctor, pharmacist, or health care provider.    2016, Elsevier/Gold Standard. (2014-08-28 14:33:36)  

## 2015-06-26 ENCOUNTER — Telehealth: Payer: Self-pay | Admitting: Family Medicine

## 2015-06-26 NOTE — Telephone Encounter (Signed)
Barbara Bird prescribed invokana but it will cost pt over $200.  Pt can't take glimepride, please prescribe something older the won't cost the pt so much.  Her call back number is [phone] and [facility] [place]

## 2015-06-26 NOTE — Telephone Encounter (Signed)
Called pt with Dr. Juanetta GoslingHawkins advised and her reply that she can't believe that there is nothing out there except glimepiride and also she doesn't have renal disease and Amy was one who mentioned that too and she wants Amy's suggestion for this and I told her that I will send her message but she need to wait for reply until Tuesday since Amy is on vacation.

## 2015-06-26 NOTE — Telephone Encounter (Signed)
There is nothing really cheap to take for DM other than the Glimeperide, which would be a perfectly good choice.  It looks like she is taking Metformin, 500 mg., 1 twice a day.  With her renal disease I would not want toincrease this.  If she does not want to try Glimerperide, then I would like for her to wait until Amy returns on Tues to make a recommendation for her.-jh

## 2015-06-30 NOTE — Telephone Encounter (Signed)
If she doesn't have kidney disease (labs suggest that she does not), then she can increase her Metformin to 500 mg., 2 in AM and ! In PM.  Could even go up to 2 twice a day later.  All other meds depend on insurance coverage, but really can maximize dose of Metformin first.-jh

## 2015-07-01 NOTE — Telephone Encounter (Signed)
Called pt, LMTCB. If she calls back, please review the following information with her: I agree with maximizing metformin. She did have an acute kidney injury with a low GFR of 48 [facility] , which is why we have been slow with her diabetes medications because I wanted to ensure her kidneys were doing well. It has been great! I removed the diagnosis from her chart- I forgot to do that after last GFR which caused Dr. Adah PerlHawkin's confusion.  We had discussed innvokana to help with weight loss and controlling blood pressure. The cheapest option right now is maximizing metformin. Thanks! AK

## 2015-07-02 NOTE — Telephone Encounter (Signed)
Pt advised as per Dr. Juanetta GoslingHawkins and Amy.

## 2015-07-23 ENCOUNTER — Other Ambulatory Visit: Payer: Self-pay | Admitting: Family Medicine

## 2015-07-23 DIAGNOSIS — E119 Type 2 diabetes mellitus without complications: Secondary | ICD-10-CM

## 2015-07-23 LAB — BASIC METABOLIC PANEL
BUN/Creatinine Ratio: 23 (ref 9–23)
BUN: 14 mg/dL (ref 6–24)
CO2: 21 mmol/L (ref 18–29)
Calcium: 8.7 mg/dL (ref 8.7–10.2)
Chloride: 102 mmol/L (ref 96–106)
Creatinine, Ser: 0.61 mg/dL (ref 0.57–1.00)
GFR calc Af Amer: 124 mL/min/{1.73_m2} (ref >59)
GFR calc non Af Amer: 108 mL/min/{1.73_m2} (ref >59)
GLUCOSE: 138 mg/dL — AB (ref 65–99)
POTASSIUM: 4.5 mmol/L (ref 3.5–5.2)
SODIUM: 140 mmol/L (ref 134–144)

## 2015-07-23 LAB — HEMOGLOBIN A1C
Est. average glucose Bld gHb Est-mCnc: 148 mg/dL
HEMOGLOBIN A1C: 6.8 % — AB (ref 4.8–5.6)

## 2015-07-23 MED ORDER — METFORMIN HCL 1000 MG PO TABS: 1000.0000 mg | ORAL_TABLET | Freq: Two times a day (BID) | ORAL | Status: DC

## 2015-08-05 ENCOUNTER — Ambulatory Visit
Admission: RE | Admit: 2015-08-05 | Discharge: 2015-08-05 | Disposition: A | Discharge status: Home / Self Care | Payer: BLUE CROSS/BLUE SHIELD | Source: Ambulatory Visit | Attending: Family Medicine | Admitting: Family Medicine

## 2015-08-05 ENCOUNTER — Encounter: Payer: Self-pay | Admitting: Family Medicine

## 2015-08-05 ENCOUNTER — Ambulatory Visit (INDEPENDENT_AMBULATORY_CARE_PROVIDER_SITE_OTHER): Payer: BLUE CROSS/BLUE SHIELD | Admitting: Family Medicine

## 2015-08-05 VITALS — BP 128/74 | HR 80 | Temp 97.7°F | Resp 16 | Ht 63.0 in | Wt 249.0 lb

## 2015-08-05 DIAGNOSIS — M7062 Trochanteric bursitis, left hip: Secondary | ICD-10-CM | POA: Diagnosis not present

## 2015-08-05 DIAGNOSIS — M7042 Prepatellar bursitis, left knee: Secondary | ICD-10-CM

## 2015-08-05 DIAGNOSIS — M5136 Other intervertebral disc degeneration, lumbar region: Secondary | ICD-10-CM | POA: Diagnosis not present

## 2015-08-05 DIAGNOSIS — M25562 Pain in left knee: Secondary | ICD-10-CM | POA: Diagnosis present

## 2015-08-05 MED ORDER — NAPROXEN 500 MG PO TABS: 500.0000 mg | ORAL_TABLET | Freq: Two times a day (BID) | ORAL | Status: DC

## 2015-08-05 NOTE — Assessment & Plan Note (Signed)
Encouraged pt to make appt with Dr. Yves Dillhasnis for joint injections for pain relief.

## 2015-08-05 NOTE — Progress Notes (Signed)
Subjective:    Patient ID: Barbara Bird, female    DOB: 19-Sep-1965, 50 y.o.   MRN: [id number]  HPI: Barbara Bird is a 50 y.o. female presenting on 08/05/2015 for Knee Pain   HPI  Pt presents for L knee pain. Symptoms have been present for 2-3 weeks. No known injury. Felt it when kneeling down. When she kneels it feels like legos are stabbing into the knee. Pain is 8/10. Pt is taking nothing for pain- not painful when doing anything but kneeling. No locking, clicking, popping of the knee. Knee even hurts on soft surface. Not tender to touch. Knee feels stable when ambulate. No injury or trauma to the knee. Pain is located on lateral side of anterior surface.   Pt is also reporting an increase in back pain. Has known degenerative disc disease and was seeing ortho regularly. Stopped due to them telling her surgery was her only option. Increasing back and hip pain. She has stopped seeing chiropractor due to cost.   Sees Dr. Yves Dillhasnis at [facility].   Past Medical History  Diagnosis Date  . Hypertension   . Chronic kidney disease     Current Outpatient Prescriptions on File Prior to Visit  Medication Sig  . atorvastatin (LIPITOR) 10 MG tablet Take 1 tablet (10 mg total) by mouth daily.  . canagliflozin (INVOKANA) 100 MG TABS tablet Take 1 tablet (100 mg total) by mouth daily before breakfast.  . carisoprodol (SOMA) 350 MG tablet 1 po bid prn  . Carisoprodol-Aspirin (SOMA COMPOUND PO) Take by mouth as needed.  . gabapentin (NEURONTIN) 300 MG capsule Take 1 capsule (300 mg total) by mouth 3 (three) times daily.  Marland Kitchen. lisinopril-hydrochlorothiazide (PRINZIDE,ZESTORETIC) 20-12.5 MG tablet Take 1 tablet by mouth daily.  . Menthol, Topical Analgesic, 10 % LIQD   . metFORMIN (GLUCOPHAGE) 1000 MG tablet Take 1 tablet (1,000 mg total) by mouth 2 (two) times daily with a meal. (Patient taking differently: Take 1,000 mg by mouth 2 (two) times daily with a meal. Pt only takes 1500 per day thinks  changing eye vision)  . Nerve Stimulator (STANDARD TENS) DEVI by Does not apply route.   No current facility-administered medications on file prior to visit.    Review of Systems  Constitutional: Negative for fever and chills.  HENT: Negative.   Respiratory: Negative for cough, chest tightness and wheezing.   Cardiovascular: Negative for chest pain and leg swelling.  Gastrointestinal: Negative for nausea, vomiting, abdominal pain, diarrhea and constipation.  Endocrine: Negative.  Negative for cold intolerance, heat intolerance, polydipsia, polyphagia and polyuria.  Genitourinary: Negative for dysuria and difficulty urinating.  Musculoskeletal: Positive for back pain, joint swelling and arthralgias. Negative for neck pain and neck stiffness.  Neurological: Negative for dizziness, light-headedness and numbness.  Psychiatric/Behavioral: Negative.    Per HPI unless specifically indicated above     Objective:    BP 128/74 mmHg  Pulse 80  Temp(Src) 97.7 F (36.5 C) (Oral)  Resp 16  Ht 5\' 3"  (1.6 m)  Wt 249 lb (112.946 kg)  BMI 44.12 kg/m2  LMP 06/30/2015 (Approximate)  Wt Readings from Last 3 Encounters:  08/05/15 249 lb (112.946 kg)  05/27/15 248 lb (112.492 kg)  02/24/15 250 lb (113.399 kg)    Physical Exam  Constitutional: She is oriented to person, place, and time. She appears well-developed and well-nourished.  HENT:  Head: Normocephalic and atraumatic.  Cardiovascular: Normal rate and regular rhythm.  Exam reveals no gallop and no friction rub.  No murmur heard. Pulmonary/Chest: Effort normal and breath sounds normal. She has no wheezes. She exhibits no tenderness.  Musculoskeletal:       Right knee: Normal.       Left knee: She exhibits normal range of motion, no swelling, no effusion, no deformity, no laceration, no LCL laxity, normal patellar mobility, no bony tenderness, normal meniscus and no MCL laxity. Tenderness (only when kneeling on exam chair- L lateral  surface. ) found.       Lumbar back: She exhibits decreased range of motion and pain.  + straight leg test bilaterally at 15 degrees.   Neurological: She is alert and oriented to person, place, and time. She has normal strength.  Reflex Scores:      Patellar reflexes are 2+ on the right side and 2+ on the left side. Psychiatric: She has a normal mood and affect. Her behavior is normal. Judgment and thought content normal.   Results for orders placed or performed in visit on 05/27/15  Hemoglobin A1c  Result Value Ref Range   Hgb A1c MFr Bld 6.8 (H) 4.8 - 5.6 %   Est. average glucose Bld gHb Est-mCnc 148 mg/dL  Basic Metabolic Panel (BMET)  Result Value Ref Range   Glucose 138 (H) 65 - 99 mg/dL   BUN 14 6 - 24 mg/dL   Creatinine, Ser 0.45 0.57 - 1.00 mg/dL   GFR calc non Af Amer 108 >59 mL/min/1.73   GFR calc Af Amer 124 >59 mL/min/1.73   BUN/Creatinine Ratio 23 9 - 23   Sodium 140 134 - 144 mmol/L   Potassium 4.5 3.5 - 5.2 mmol/L   Chloride 102 96 - 106 mmol/L   CO2 21 18 - 29 mmol/L   Calcium 8.7 8.7 - 10.2 mg/dL      Assessment & Plan:   Problem List Items Addressed This Visit      Musculoskeletal and Integument   DDD (degenerative disc disease), lumbar    Encouraged pt to discuss surgical options with ortho. Offered to write of PT to help with pain and discomfort. Pt declined due to financial reasons.       Relevant Medications   naproxen (NAPROSYN) 500 MG tablet   Bursitis, trochanteric    Encouraged pt to make appt with Dr. Yves Dill for joint injections for pain relief.        Other Visit Diagnoses    Housemaid's knee, left    -  Primary    Suspect bursistis of knee due to frequent kneeling and presenstation. XR to r/o other causes.  2 weeks of NSAIDs to help with pain. Return if not improving. Consider ortho or PT.     Relevant Medications    naproxen (NAPROSYN) 500 MG tablet    Other Relevant Orders    DG Knee Complete 4 Views Left (Completed)       Meds  ordered this encounter  Medications  . naproxen (NAPROSYN) 500 MG tablet    Sig: Take 1 tablet (500 mg total) by mouth 2 (two) times daily with a meal.    Dispense:  30 tablet    Refill:  0    Order Specific Question:  Supervising Provider    Answer:  Janeann Forehand [[id number]]      Follow up plan: Return in about 2 weeks (around 08/19/2015), or if symptoms worsen or fail to improve.

## 2015-08-05 NOTE — Assessment & Plan Note (Signed)
Encouraged pt to discuss surgical options with ortho. Offered to write of PT to help with pain and discomfort. Pt declined due to financial reasons.

## 2015-08-05 NOTE — Patient Instructions (Signed)
We will get an XR of your knee to determine if there is anything broken or swelling in the joint space. Take naproxen twice daily for 1-2 weeks to see if that improves your symptoms. Try a knee brace to cushion knee when bending.  If symptoms don't improve, we will have you see an orthopedist.

## 2015-08-17 ENCOUNTER — Other Ambulatory Visit: Payer: Self-pay | Admitting: Family Medicine

## 2015-08-27 ENCOUNTER — Telehealth: Payer: Self-pay | Admitting: Family Medicine

## 2015-08-27 NOTE — Telephone Encounter (Signed)
Pt.stopped by wanting to know  If you would write a letter stating that she could not work for pt. Divorce if it come to this. Pt call back # [phone]

## 2015-08-28 NOTE — Telephone Encounter (Signed)
Called LMTCB. 

## 2015-10-23 ENCOUNTER — Ambulatory Visit (INDEPENDENT_AMBULATORY_CARE_PROVIDER_SITE_OTHER): Payer: BLUE CROSS/BLUE SHIELD | Admitting: Family Medicine

## 2015-10-23 ENCOUNTER — Encounter: Payer: Self-pay | Admitting: Family Medicine

## 2015-10-23 VITALS — BP 125/75 | HR 100 | Temp 97.9°F | Resp 16 | Ht 63.0 in | Wt 246.0 lb

## 2015-10-23 DIAGNOSIS — E785 Hyperlipidemia, unspecified: Secondary | ICD-10-CM

## 2015-10-23 DIAGNOSIS — E1169 Type 2 diabetes mellitus with other specified complication: Secondary | ICD-10-CM | POA: Diagnosis not present

## 2015-10-23 DIAGNOSIS — E119 Type 2 diabetes mellitus without complications: Secondary | ICD-10-CM | POA: Diagnosis not present

## 2015-10-23 DIAGNOSIS — I10 Essential (primary) hypertension: Secondary | ICD-10-CM | POA: Diagnosis not present

## 2015-10-23 NOTE — Assessment & Plan Note (Signed)
Controlled. Check CMP.  

## 2015-10-23 NOTE — Assessment & Plan Note (Signed)
Recheck LDL. Last 33- plan to reduce statin with LDL <25.

## 2015-10-23 NOTE — Patient Instructions (Signed)
Please make appt for fasting lab work to be done sometime in the near future and then make an appt for fasting lab work about 3 mos from your lab appt.  We will do your diabetes follow-up 3 mos from the point.

## 2015-10-23 NOTE — Assessment & Plan Note (Addendum)
Maximized metformin. Will check HgA1c with labs. Encouraged daily exercise, diet, and lifestyle changes.  Recheck 3 mos.

## 2015-10-23 NOTE — Progress Notes (Signed)
Subjective:    Patient ID: Barbara Bird, female    DOB: 1965-08-07, 50 y.o.   MRN: [id number]  HPI: Barbara Bird is a 50 y.o. female presenting on 10/23/2015 for Diabetes and Knee Pain   HPI  Pt presents for diabetes follow-up. She has maxmized her metformin- to 1500mg . 2000mg  gave her blurry vision. Pt has had  Just had pork for lunch. Not fasting today. Has not had an eye exam this year.  Knee pain has resolved.   Past Medical History  Diagnosis Date  . Hypertension   . Chronic kidney disease     Current Outpatient Prescriptions on File Prior to Visit  Medication Sig  . atorvastatin (LIPITOR) 10 MG tablet Take 1 tablet (10 mg total) by mouth daily.  . carisoprodol (SOMA) 350 MG tablet 1 po bid prn  . gabapentin (NEURONTIN) 300 MG capsule TAKE ONE CAPSULE THREE TIMES DAILY  . lisinopril-hydrochlorothiazide (PRINZIDE,ZESTORETIC) 20-12.5 MG tablet Take 1 tablet by mouth daily.  . Menthol, Topical Analgesic, 10 % LIQD   . metFORMIN (GLUCOPHAGE) 1000 MG tablet Take 1 tablet (1,000 mg total) by mouth 2 (two) times daily with a meal. (Patient taking differently: Take 1,000 mg by mouth 2 (two) times daily with a meal. Pt only takes 1500 per day thinks changing eye vision)  . naproxen (NAPROSYN) 500 MG tablet Take 1 tablet (500 mg total) by mouth 2 (two) times daily with a meal.  . Nerve Stimulator (STANDARD TENS) DEVI by Does not apply route.   No current facility-administered medications on file prior to visit.    Review of Systems  Constitutional: Negative for fever and chills.  HENT: Negative.   Respiratory: Negative for cough, chest tightness and wheezing.   Cardiovascular: Negative for chest pain and leg swelling.  Gastrointestinal: Negative for nausea, vomiting, abdominal pain, diarrhea and constipation.  Endocrine: Negative.  Negative for cold intolerance, heat intolerance, polydipsia, polyphagia and polyuria.  Genitourinary: Negative for dysuria and difficulty  urinating.  Musculoskeletal: Negative.   Neurological: Negative for dizziness, light-headedness and numbness.  Psychiatric/Behavioral: Negative.    Per HPI unless specifically indicated above     Objective:    BP 125/75 mmHg  Pulse 100  Temp(Src) 97.9 F (36.6 C) (Oral)  Resp 16  Ht 5\' 3"  (1.6 m)  Wt 246 lb (111.585 kg)  BMI 43.59 kg/m2  SpO2 99%  LMP 09/30/2015 (Approximate)  Wt Readings from Last 3 Encounters:  10/23/15 246 lb (111.585 kg)  08/05/15 249 lb (112.946 kg)  05/27/15 248 lb (112.492 kg)    Physical Exam  Constitutional: She is oriented to person, place, and time. She appears well-developed and well-nourished.  HENT:  Head: Normocephalic and atraumatic.  Neck: Neck supple.  Cardiovascular: Normal rate, regular rhythm and normal heart sounds.  Exam reveals no gallop and no friction rub.   No murmur heard. Pulmonary/Chest: Effort normal and breath sounds normal. She has no wheezes. She exhibits no tenderness.  Abdominal: Soft. Normal appearance and bowel sounds are normal. She exhibits no distension and no mass. There is no tenderness. There is no rebound and no guarding.  Musculoskeletal: Normal range of motion. She exhibits no edema or tenderness.  Lymphadenopathy:    She has no cervical adenopathy.  Neurological: She is alert and oriented to person, place, and time.  Skin: Skin is warm and dry.  Psychiatric: She has a normal mood and affect. Her behavior is normal. Judgment and thought content normal.   Diabetic Foot Exam - Simple  Simple Foot Form  Diabetic Foot exam was performed with the following findings:  Yes 10/23/2015  2:03 PM  Visual Inspection  No deformities, no ulcerations, no other skin breakdown bilaterally:  Yes  Sensation Testing  Intact to touch and monofilament testing bilaterally:  Yes  Pulse Check  Posterior Tibialis and Dorsalis pulse intact bilaterally:  Yes  Comments      Results for orders placed or performed in visit on  05/27/15  Hemoglobin A1c  Result Value Ref Range   Hgb A1c MFr Bld 6.8 (H) 4.8 - 5.6 %   Est. average glucose Bld gHb Est-mCnc 148 mg/dL  Basic Metabolic Panel (BMET)  Result Value Ref Range   Glucose 138 (H) 65 - 99 mg/dL   BUN 14 6 - 24 mg/dL   Creatinine, Ser 0.98 0.57 - 1.00 mg/dL   GFR calc non Af Amer 108 >59 mL/min/1.73   GFR calc Af Amer 124 >59 mL/min/1.73   BUN/Creatinine Ratio 23 9 - 23   Sodium 140 134 - 144 mmol/L   Potassium 4.5 3.5 - 5.2 mmol/L   Chloride 102 96 - 106 mmol/L   CO2 21 18 - 29 mmol/L   Calcium 8.7 8.7 - 10.2 mg/dL      Assessment & Plan:   Problem List Items Addressed This Visit      Cardiovascular and Mediastinum   BP (high blood pressure)    Controlled. Check CMP         Endocrine   Diabetes mellitus, type 2 (HCC) - Primary    Maximized metformin. Will check HgA1c with labs. Encouraged daily exercise, diet, and lifestyle changes.  Recheck 3 mos.        Relevant Orders   Hemoglobin A1c   Ambulatory referral to Ophthalmology   Urine Microalbumin w/creat. ratio   Basic Metabolic Panel (BMET)     Other   Hyperlipidemia associated with type 2 diabetes mellitus (HCC)    Recheck LDL. Last 33- plan to reduce statin with LDL <25.       Relevant Orders   Lipid panel      Meds ordered this encounter  Medications  . ibuprofen (ADVIL,MOTRIN) 600 MG tablet    Sig:     Refill:  0      Follow up plan: Return in about 3 months (around 01/23/2016) for Diabetes. Marland Kitchen

## 2015-10-24 ENCOUNTER — Other Ambulatory Visit: Payer: No Typology Code available for payment source

## 2015-10-24 ENCOUNTER — Other Ambulatory Visit: Payer: Self-pay | Admitting: Family Medicine

## 2015-10-24 DIAGNOSIS — E119 Type 2 diabetes mellitus without complications: Secondary | ICD-10-CM

## 2015-10-24 LAB — BASIC METABOLIC PANEL
BUN: 13 mg/dL (ref 7–25)
CALCIUM: 8.5 mg/dL — AB (ref 8.6–10.2)
CHLORIDE: 106 mmol/L (ref 98–110)
CO2: 21 mmol/L (ref 20–31)
CREATININE: 0.54 mg/dL (ref 0.50–1.10)
Glucose, Bld: 153 mg/dL — ABNORMAL HIGH (ref 65–99)
Potassium: 4.1 mmol/L (ref 3.5–5.3)
Sodium: 141 mmol/L (ref 135–146)

## 2015-10-24 LAB — LIPID PANEL
CHOL/HDL RATIO: 3.6 ratio (ref <=5.0)
Cholesterol: 103 mg/dL — ABNORMAL LOW (ref 125–200)
HDL: 29 mg/dL — AB (ref >=46)
LDL CALC: 40 mg/dL (ref <130)
TRIGLYCERIDES: 172 mg/dL — AB (ref <150)
VLDL: 34 mg/dL — AB (ref <30)

## 2015-10-24 LAB — POCT UA - MICROALBUMIN: MICROALBUMIN (UR) POC: 0 mg/L

## 2015-10-24 LAB — HEMOGLOBIN A1C
Hgb A1c MFr Bld: 6.3 % — ABNORMAL HIGH (ref <5.7)
Mean Plasma Glucose: 134 mg/dL

## 2015-10-24 NOTE — Addendum Note (Signed)
Addended by: Elvina MattesPATEL, NISHABEN D on: 10/24/2015 09:53 AM   Modules accepted: Orders

## 2016-02-04 ENCOUNTER — Other Ambulatory Visit: Payer: No Typology Code available for payment source

## 2016-02-04 ENCOUNTER — Other Ambulatory Visit: Payer: Self-pay | Admitting: Family Medicine

## 2016-02-04 DIAGNOSIS — I10 Essential (primary) hypertension: Secondary | ICD-10-CM

## 2016-02-04 DIAGNOSIS — E119 Type 2 diabetes mellitus without complications: Secondary | ICD-10-CM

## 2016-02-04 LAB — BASIC METABOLIC PANEL
BUN: 17 mg/dL (ref 7–25)
CO2: 25 mmol/L (ref 20–31)
Calcium: 9.2 mg/dL (ref 8.6–10.2)
Chloride: 103 mmol/L (ref 98–110)
Creat: 0.68 mg/dL (ref 0.50–1.10)
GLUCOSE: 189 mg/dL — AB (ref 65–99)
POTASSIUM: 4.2 mmol/L (ref 3.5–5.3)
SODIUM: 138 mmol/L (ref 135–146)

## 2016-02-04 LAB — CBC WITH DIFFERENTIAL/PLATELET
BASOS ABS: 0 {cells}/uL (ref 0–200)
Basophils Relative: 0 %
EOS ABS: 234 {cells}/uL (ref 15–500)
Eosinophils Relative: 2 %
HCT: 41.4 % (ref 35.0–45.0)
HEMOGLOBIN: 13.9 g/dL (ref 11.7–15.5)
LYMPHS ABS: 3510 {cells}/uL (ref 850–3900)
Lymphocytes Relative: 30 %
MCH: 31.6 pg (ref 27.0–33.0)
MCHC: 33.6 g/dL (ref 32.0–36.0)
MCV: 94.1 fL (ref 80.0–100.0)
MONOS PCT: 7 %
MPV: 9.5 fL (ref 7.5–12.5)
Monocytes Absolute: 819 cells/uL (ref 200–950)
NEUTROS ABS: 7137 {cells}/uL (ref 1500–7800)
Neutrophils Relative %: 61 %
Platelets: 282 10*3/uL (ref 140–400)
RBC: 4.4 MIL/uL (ref 3.80–5.10)
RDW: 13.4 % (ref 11.0–15.0)
WBC: 11.7 10*3/uL — ABNORMAL HIGH (ref 3.8–10.8)

## 2016-02-04 LAB — LIPID PANEL
Cholesterol: 143 mg/dL (ref 125–200)
HDL: 29 mg/dL — AB (ref >=46)
LDL CALC: 54 mg/dL (ref <130)
Total CHOL/HDL Ratio: 4.9 Ratio (ref <=5.0)
Triglycerides: 302 mg/dL — ABNORMAL HIGH (ref <150)
VLDL: 60 mg/dL — AB (ref <30)

## 2016-02-05 ENCOUNTER — Telehealth: Payer: Self-pay | Admitting: *Deleted

## 2016-02-05 LAB — HEMOGLOBIN A1C
HEMOGLOBIN A1C: 7.1 % — AB (ref <5.7)
Mean Plasma Glucose: 157 mg/dL

## 2016-02-05 NOTE — Progress Notes (Unsigned)
Patient aware of lab results. Patient DOES NOT want kidney checked again.

## 2016-02-13 NOTE — Telephone Encounter (Signed)
Erroneous encounter

## 2016-02-16 ENCOUNTER — Ambulatory Visit (INDEPENDENT_AMBULATORY_CARE_PROVIDER_SITE_OTHER): Payer: BLUE CROSS/BLUE SHIELD | Admitting: Family Medicine

## 2016-02-16 ENCOUNTER — Encounter: Payer: Self-pay | Admitting: Family Medicine

## 2016-02-16 VITALS — BP 121/77 | HR 84 | Temp 98.4°F | Resp 16 | Ht 63.0 in | Wt 248.0 lb

## 2016-02-16 DIAGNOSIS — I10 Essential (primary) hypertension: Secondary | ICD-10-CM | POA: Diagnosis not present

## 2016-02-16 DIAGNOSIS — M5441 Lumbago with sciatica, right side: Secondary | ICD-10-CM | POA: Diagnosis not present

## 2016-02-16 DIAGNOSIS — E785 Hyperlipidemia, unspecified: Secondary | ICD-10-CM

## 2016-02-16 DIAGNOSIS — E119 Type 2 diabetes mellitus without complications: Secondary | ICD-10-CM

## 2016-02-16 DIAGNOSIS — E1169 Type 2 diabetes mellitus with other specified complication: Secondary | ICD-10-CM

## 2016-02-16 DIAGNOSIS — A6000 Herpesviral infection of urogenital system, unspecified: Secondary | ICD-10-CM | POA: Diagnosis not present

## 2016-02-16 DIAGNOSIS — M5442 Lumbago with sciatica, left side: Secondary | ICD-10-CM | POA: Diagnosis not present

## 2016-02-16 DIAGNOSIS — G8929 Other chronic pain: Secondary | ICD-10-CM | POA: Diagnosis not present

## 2016-02-16 MED ORDER — VALACYCLOVIR HCL 1 G PO TABS: 1000.0000 mg | ORAL_TABLET | Freq: Two times a day (BID) | ORAL | 3 refills | Status: DC

## 2016-02-16 MED ORDER — ATORVASTATIN CALCIUM 10 MG PO TABS: 10.0000 mg | ORAL_TABLET | Freq: Every day | ORAL | 11 refills | Status: DC

## 2016-02-16 MED ORDER — LISINOPRIL-HYDROCHLOROTHIAZIDE 20-12.5 MG PO TABS: 1.0000 | ORAL_TABLET | Freq: Every day | ORAL | 11 refills | Status: DC

## 2016-02-16 MED ORDER — METFORMIN HCL 500 MG PO TABS: 500.0000 mg | ORAL_TABLET | Freq: Every day | ORAL | 11 refills | Status: DC

## 2016-02-16 NOTE — Assessment & Plan Note (Addendum)
Gradual worsening chronic low back pain in setting of multifactorial etiology with lumbar DJD, facet DJD, radiculopathy with sciatica and other disc injury. Also with bilateral trochanteric bursitis. - Checked [place] CSRS today for past 1 yr (02/16/15-2017) appropriate rx Soma by one provider Dr Drenda Freezehisnis only without red flags - Suspect patient is underutilizing medications for conservative therapy, she is doing well with topical therapy (heat/ice, TENs, biofreeze)  Plan: 1. [facility] Dr [facility]ren[facility], also for refills on Soma if she is continuing this medication, advised her that it is a controlled substance and not recommended for chronic use and I do not plan to rx this for her in the future 2. Start regular dosing Aleve OTC 220mg  x 2 tabs per dose BID for 1-2 weeks then PRN for flares 3. Advised on Tylenol Ext Str 500mg  - take 1000mg  per dose TID for breakthrough pain 4. Continue other topical conservative therapy 5. Titrate up on Gabapentin - currently 300mg  BID, resume TID dosing, then q monthly can go up by 300mg  per one dose, up to max 600mg  TID as discussed 6. Future consider options such as Lyrica, Cymbalta - Notify office in 05/2016 if handicap placard is expiring, and we will be able to write for permanent placard

## 2016-02-16 NOTE — Patient Instructions (Signed)
Thank you for coming in to clinic today.  1. For Diabetes - A1c 7.1 (gradually increased from previous around 6.3, but overall similar range of 6.0 to 7.0 - Continue Metformin as you are, refilled 500mg  pill today - Strong recommendation to focus on dietary improvements to help keep DM and Cholesterol in control, to avoid future medication changes, especially with limited exercise due to your pain - We will check A1c test again in 6 months as discussed  2. Continue Atorvastatin, refill sent  3. Continue Lisinopril-HCTZ for BP, refill sent  4. Refill sent on valtrex 1000mg , take 1 twice a day for 5-10 days as needed for flare -up  5. For chronic pain - Recommend to gradually increase Gabapentin. May go ahead and start 300mg  per dose 3 times a day now for 1 month, then if tolerating well, you can choose one of those 3 doses to increase to 2 capsules or 600mg , continue on this plan as tolerated up to max dose 600mg  (2 pills) per dose 3 times a day  Recommend trial of Anti-inflammatory with Aleve OTC 220mg  tabs - take TWO with food and plenty of water TWICE daily every day (breakfast and dinner), for next 2 to 4 weeks, then you may take only as needed - DO NOT TAKE any ibuprofen, motrin while you are taking this medicine - It is safe to take Tylenol Ext Str 500mg  tabs - take 1 to 2 (max dose 1000mg ) every 6-8 hours or 3 times a day as needed for breakthrough pain, max 24 hour daily dose is 6 tabs for 3000mg    Please schedule a follow-up appointment with Dr. Althea CharonKaramalegos in 6 months for Diabetes follow-up, schedule LAB ONLY visit 1 week before this visit for A1c  If you have any other questions or concerns, please feel free to call the clinic or send a message through MyChart. You may also schedule an earlier appointment if necessary.  Saralyn PilarAlexander Hasan Douse, DO [facility]
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
## 2016-02-16 NOTE — Assessment & Plan Note (Signed)
Mixed hyperlipidemia, with controlled LDL (gradual increase now on reduced statin), uncontrolled TG (likely with recent dietary discretions), remains low HDL 29 due to no exercise (limited by chronic pain) - Note allergic to fish oil  Plan: 1. Continue Atorvastatin 10mg  daily, refilled today - future if remains uncontrolled despite improved DM diet, will discuss increase back to 20-40mg  2. Check fasting lipids 1 year, 01/2017

## 2016-02-16 NOTE — Assessment & Plan Note (Signed)
Well-controlled HTN No complications - suspected false prior diagnosis with CKD in past, this was resolved from her history, likely only AKI that had resolved   Plan:  1. Continue current BP meds - Lisinopril-HCTZ 20-12.5mg  daily, refilled for 1 year supply 2. Reviewed last BMET 01/2016, normal Cr, monitor yearly 3. Lifestyle Mods - Limited exercise due to chronic pain. Continue diet modifications 4. Follow-up 6 mo

## 2016-02-16 NOTE — Assessment & Plan Note (Signed)
Chronic problem, genital HSV2 with recurrent flares Refill Valtrex 1000mg  BID for 5-10 days as needed for flares

## 2016-02-16 NOTE — Assessment & Plan Note (Signed)
Recent worsening control from A1c 6.3 to 7.1, overall remains well controlled, over past 1-2 years fluctuating A1c from 6.0 to 7.0 No complications or hypoglycemia  Plan:  1. Continue current therapy - Metformin 1500mg  daily - 1000mg  in AM / 500mg  nightly, sent refill for 500mg  pills to pharmacy 2. Lifestyle Mods - stressed importance of improved DM diet (recent dietary discretion on cruise), given significantly limited exercise due to chronic pain 3. Offered Flu shot, PNA vaccine - declined due to severe needle phobia, counseling given 4. Overdue for ophthalmology exam - patient declines due to financial reasons 5. Follow-up q 6 months, A1c (future order placed) - okay to space out visits to q 6 months, given her financial concerns, advised may need to add other DM medications in future if trend of worsening A1c control

## 2016-04-22 ENCOUNTER — Other Ambulatory Visit: Payer: Self-pay | Admitting: Family Medicine

## 2016-04-22 DIAGNOSIS — M549 Dorsalgia, unspecified: Secondary | ICD-10-CM

## 2016-04-22 DIAGNOSIS — M5116 Intervertebral disc disorders with radiculopathy, lumbar region: Secondary | ICD-10-CM

## 2016-04-22 DIAGNOSIS — G8929 Other chronic pain: Secondary | ICD-10-CM

## 2016-04-23 MED ORDER — GABAPENTIN 300 MG PO CAPS: ORAL_CAPSULE | ORAL | 3 refills | Status: DC

## 2016-05-10 ENCOUNTER — Ambulatory Visit
Admission: RE | Admit: 2016-05-10 | Discharge: 2016-05-10 | Disposition: A | Discharge status: Home / Self Care | Payer: BLUE CROSS/BLUE SHIELD | Source: Ambulatory Visit | Attending: Family Medicine | Admitting: Family Medicine

## 2016-05-10 ENCOUNTER — Ambulatory Visit (INDEPENDENT_AMBULATORY_CARE_PROVIDER_SITE_OTHER): Payer: BLUE CROSS/BLUE SHIELD | Admitting: Family Medicine

## 2016-05-10 ENCOUNTER — Encounter: Payer: Self-pay | Admitting: Family Medicine

## 2016-05-10 VITALS — BP 130/85 | HR 98 | Temp 98.2°F | Resp 16 | Ht 63.0 in | Wt 255.6 lb

## 2016-05-10 DIAGNOSIS — M5442 Lumbago with sciatica, left side: Secondary | ICD-10-CM | POA: Diagnosis not present

## 2016-05-10 DIAGNOSIS — R0602 Shortness of breath: Secondary | ICD-10-CM | POA: Insufficient documentation

## 2016-05-10 DIAGNOSIS — M5441 Lumbago with sciatica, right side: Secondary | ICD-10-CM | POA: Diagnosis not present

## 2016-05-10 DIAGNOSIS — R0601 Orthopnea: Secondary | ICD-10-CM

## 2016-05-10 DIAGNOSIS — G8929 Other chronic pain: Secondary | ICD-10-CM | POA: Diagnosis not present

## 2016-05-10 MED ORDER — HYDROXYZINE HCL 10 MG PO TABS: 10.0000 mg | ORAL_TABLET | Freq: Three times a day (TID) | ORAL | 0 refills | Status: DC | PRN

## 2016-05-10 NOTE — Assessment & Plan Note (Signed)
Completed paperwork for permanent handicap placard, due to severe arthritic orthopedic condition limiting mobility, and unable to walk >200 ft without stopping to rest. Uses cane sometimes but not dependent on mobility device at this time. - Patient to be notified pick up papers, finish her part, and [facility] - Follow-up in future

## 2016-05-10 NOTE — Assessment & Plan Note (Signed)
Unclear exact etiology, clinically appears well today but is slightly anxious and has some mild occasional intermittent increased work of breathing, but she is very conversant without difficulty for most of visit. Clinically she has good resp effort and lungs are clear, clinical volume status is mostly euvolemic with possible mild inc fluid with some bilateral lower ext trace pitting edema, similar to previous with obesity. Hemodynamically stable with mild tachycardia HR 98, otherwise normal vitals, O2 on RA 98% Differential diagnosis remains broad: - Initially suspected more infectious with bronchitis vs PNA given prolonged course of illness with history, however lungs clear and afebrile - No pulmonary history with COPD/asthma in past but she is obese could have some hypoventilation syndrome (unlikely to be new acute problem) - Possible OSA given history of apnea episodes, snoring, obesity and exam - Concern for cardiac etiology with history of recent orthopnea (no prior history of CHF, no prior ECHO, concern for possible viral myocarditis with recent viral / URI symptoms, cannot rule out) - No prior known CAD or MI in past, seems stable symptoms without any exertional component less likely atypical angina. - Considered DVT/PE, no clinical sign of DVT, no prior history, Well's Score PE = 0 (tachycardia is < 100), seems low risk, she is not immobilized but is relatively sedentary still not enough to make dramatic increased risk, but consider as a possibility - Also suspect some component of anxiety with admitted worsening with anxious and conscious of herself breathing and difficulty with asynchronous breathing  Plan: 1. Check CXR today - *update* reviewed results this afternoon, unremarkable, reviewed images, called patient advised no focal infiltrate or pneumonia found, and no evidence of fluid or increased vasc congestion, heart size was read as normal 2. Check labs in AM with BMET, CBC, BNP - if  unremarkable, and BNP is low normal, negative screening test, then not clear on etiology still - likely proceed with future ECHOcardiogram regardless of BNP results if still present within 1 week 3. Rx given for Hydroxyzine 10mg  PRN anxiety, likely only use at night if disordered breathing felt due to anxiety, but not due to acute worsening dyspnea 4. Future consider sleep study for OSA after other more acute conditions ruled out 5. Strict return criteria when to go to ED or hospital for more acute evaluation, possible can check D-dimer in future if worsening as well, may need chest CTA in future if abnormal and concerned for PE, however clinically does not seem consistent and negative Well's 6. Follow-up within 1 week
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
## 2016-05-10 NOTE — Patient Instructions (Signed)
Thank you for coming in to clinic today.  1. I don't have a clear cause of your shortness of breath, however we have discussed a wide range of possibilities - Start with Chest x-ray today will follow-up results - Check blood work in the morning, for chemistry, CBC, BNP (heart failure or heart lab test for screening result), usually results within 24 hours, if BNP is low normal then this is very reassuring, otherwise if it is elevated it is a clue for possible heart problem - If all is negative, then we will most likely proceed with ECHOcardiogram sooner, and consider a more Urgent Cardiology consult if other questions - Also I think anxiety is a component, take Hydroxyzine as needed every 8 hours if anxiety is worse and seems to be affecting breathing, and continue to sleep in elevated position  In future we will likely consider Sleep Study in future for evaluating sleep apnea  If you have any significant worsening shortness of breath / chest pain that does not go away within 30 minutes, is accompanied by nausea, sweating, shortness of breath, or made worse by activity, this may be evidence of a heart attack, especially if symptoms worsening instead of improving, please call 911 or go directly to the emergency room immediately for evaluation.  Please schedule a follow-up appointment with Dr. Althea CharonKaramalegos in 1-2 weeks for shortness of breath pending results  Note I will be out of office Thurs afternoon and Friday. Dr Juanetta GoslingHawkins should be available at least half day both days  If you have any other questions or concerns, please feel free to call the clinic or send a message through MyChart. You may also schedule an earlier appointment if necessary.  Saralyn PilarAlexander Kalle Bernath, DO [facility]

## 2016-05-11 ENCOUNTER — Other Ambulatory Visit: Payer: No Typology Code available for payment source

## 2016-05-11 LAB — CBC WITH DIFFERENTIAL/PLATELET
BASOS ABS: 0 {cells}/uL (ref 0–200)
Basophils Relative: 0 %
EOS PCT: 2 %
Eosinophils Absolute: 200 cells/uL (ref 15–500)
HCT: 40.9 % (ref 35.0–45.0)
HEMOGLOBIN: 13.9 g/dL (ref 11.7–15.5)
LYMPHS ABS: 3100 {cells}/uL (ref 850–3900)
Lymphocytes Relative: 31 %
MCH: 31.5 pg (ref 27.0–33.0)
MCHC: 34 g/dL (ref 32.0–36.0)
MCV: 92.7 fL (ref 80.0–100.0)
MONOS PCT: 7 %
MPV: 9.9 fL (ref 7.5–12.5)
Monocytes Absolute: 700 cells/uL (ref 200–950)
NEUTROS ABS: 6000 {cells}/uL (ref 1500–7800)
Neutrophils Relative %: 60 %
PLATELETS: 278 10*3/uL (ref 140–400)
RBC: 4.41 MIL/uL (ref 3.80–5.10)
RDW: 13.3 % (ref 11.0–15.0)
WBC: 10 10*3/uL (ref 3.8–10.8)

## 2016-05-11 LAB — BASIC METABOLIC PANEL WITH GFR
BUN: 12 mg/dL (ref 7–25)
CALCIUM: 9 mg/dL (ref 8.6–10.2)
CO2: 21 mmol/L (ref 20–31)
Chloride: 103 mmol/L (ref 98–110)
Creat: 0.68 mg/dL (ref 0.50–1.10)
Glucose, Bld: 207 mg/dL — ABNORMAL HIGH (ref 65–99)
POTASSIUM: 3.9 mmol/L (ref 3.5–5.3)
SODIUM: 137 mmol/L (ref 135–146)

## 2016-05-12 LAB — BRAIN NATRIURETIC PEPTIDE

## 2016-07-26 ENCOUNTER — Other Ambulatory Visit: Payer: Self-pay | Admitting: Family Medicine

## 2016-07-26 DIAGNOSIS — E119 Type 2 diabetes mellitus without complications: Secondary | ICD-10-CM

## 2016-07-26 MED ORDER — METFORMIN HCL 1000 MG PO TABS: ORAL_TABLET | ORAL | 3 refills | Status: DC

## 2016-08-09 ENCOUNTER — Other Ambulatory Visit: Payer: Self-pay | Admitting: Family Medicine

## 2016-08-09 ENCOUNTER — Other Ambulatory Visit: Payer: No Typology Code available for payment source

## 2016-08-09 DIAGNOSIS — E119 Type 2 diabetes mellitus without complications: Secondary | ICD-10-CM

## 2016-08-10 LAB — HEMOGLOBIN A1C
Hgb A1c MFr Bld: 8.5 % — ABNORMAL HIGH (ref <5.7)
Mean Plasma Glucose: 197 mg/dL

## 2016-08-16 ENCOUNTER — Encounter: Payer: Self-pay | Admitting: Family Medicine

## 2016-08-16 ENCOUNTER — Ambulatory Visit (INDEPENDENT_AMBULATORY_CARE_PROVIDER_SITE_OTHER): Payer: BLUE CROSS/BLUE SHIELD | Admitting: Family Medicine

## 2016-08-16 VITALS — BP 139/77 | HR 109 | Temp 98.3°F | Resp 16 | Ht 63.0 in | Wt 249.0 lb

## 2016-08-16 DIAGNOSIS — H65192 Other acute nonsuppurative otitis media, left ear: Secondary | ICD-10-CM | POA: Diagnosis not present

## 2016-08-16 DIAGNOSIS — E1165 Type 2 diabetes mellitus with hyperglycemia: Secondary | ICD-10-CM

## 2016-08-16 DIAGNOSIS — IMO0001 Reserved for inherently not codable concepts without codable children: Secondary | ICD-10-CM

## 2016-08-16 DIAGNOSIS — Z6841 Body Mass Index (BMI) 40.0 and over, adult: Secondary | ICD-10-CM

## 2016-08-16 MED ORDER — AMOXICILLIN 500 MG PO CAPS
500.0000 mg | ORAL_CAPSULE | Freq: Two times a day (BID) | ORAL | 0 refills | Status: DC
Start: 2016-08-16 — End: 2017-10-12

## 2016-08-16 MED ORDER — BENZONATATE 100 MG PO CAPS: 100.0000 mg | ORAL_CAPSULE | Freq: Three times a day (TID) | ORAL | 0 refills | Status: DC | PRN

## 2016-08-16 NOTE — Progress Notes (Signed)
Subjective:    Patient ID: Barbara Bird, female    DOB: [birth year], 51 y.o.   MRN: [id number]  Barbara Bird is a 51 y.o. female presenting on 08/16/2016 for Diabetes   HPI   Sore Throat / Hoarse Voice / Sinusitis: - Reports persistent problem since 1 week ago Monday 4/30, she went out with friends, symptoms started with "throat tickling" and irritation, and then significant worsening sore throat and hoarse voice since 5 days, she went to [facility] 2 days ago on Saturday, tested negative for Strep Throat, given rx Flonase, and [employer]'s Magic Mouthwash (stopped using), also tried Cepacol - Not taking 2nd gen anti-histamine - Some reduced appetite and poor eating due to painful swallowing - Sick contact at home with son having some viral URI nausea - Admits cough (very mildly productive), Intermittent nausea, ear pain and pressure (Left > Right) - Denies fevers/chills, sweats, vomiting  CHRONIC DM, Type 2 / Morbid Obesity BMI >44 - Last visit with me 02/2017 for DM, she has had worsening A1c over past >6 months, attributed to variety of issues primarily stress (primarily with her son having mental health problems and recent suicidal ideation) and "not taking care of self", was told in past due to elevated cortisol, had problems with unable to lose weight despite exercise -Does not check CBGs due to needle phobia.  Meds: Metformin 1000mg  AM / 500mg  nightly, tolerates well, previously couldn't tolerate 2000mg  daily due to vision problems, has been on for about 4 years now Reports good compliance. Tolerating well w/o side-effects Currently on ACEi - No known complications. Prior reported history of CKD in 2016 however patient states this was incorrect dx from prior doctor, and repeat kidney function test was normal. No known DM neuropathy, complicated by chronic low back pain with history of sciatica. - No regular exercise, see below chronic pain Denies hypoglycemia, polyuria,  visual changes, numbness or tingling.   Social History  Substance Use Topics  . Smoking status: Never Smoker  . Smokeless tobacco: Never Used  . Alcohol use Yes     Comment: occasional    Review of Systems Per HPI unless specifically indicated above     Objective:    BP 139/77   Pulse (!) 109   Temp 98.3 F (36.8 C) (Oral)   Resp 16   Ht 5\' 3"  (1.6 m)   Wt 249 lb (112.9 kg)   BMI 44.11 kg/m   Wt Readings from Last 3 Encounters:  08/16/16 249 lb (112.9 kg)  05/10/16 255 lb 9.6 oz (115.9 kg)  02/16/16 248 lb (112.5 kg)    Physical Exam  Constitutional: She is oriented to person, place, and time. She appears well-developed and well-nourished. No distress.  Uncomfortable with sore throat, cooperative, obese  HENT:  Head: Normocephalic and atraumatic.  Mouth/Throat: Oropharynx is clear and moist.  Frontal / maxillary sinuses non-tender. Nares patent without purulence or edema. Left TM with erythema and some bulging, without significant purulence or effusion, R TM with some fullness and effusion without significant erythema.  Oropharynx with posterior nasal drainage without obvious erythema, exudates, edema or asymmetry.  Eyes: Conjunctivae are normal. Right eye exhibits no discharge. Left eye exhibits no discharge.  Neck: Normal range of motion. Neck supple.  Cardiovascular: Regular rhythm, normal heart sounds and intact distal pulses.   No murmur heard. Tachycardic  Pulmonary/Chest: Effort normal and breath sounds normal. No respiratory distress. She has no wheezes. She has no rales.  Good air movement  on exam. Speaks full sentences without significant difficulty, maybe takes additional deep breath with excessive talking but conversational speech is normal.  Musculoskeletal: She exhibits edema (Bilateral +trace pitting edema lower legs below knees to ankle).  Lymphadenopathy:    She has no cervical adenopathy.  Neurological: She is alert and oriented to person, place,  and time.  Skin: Skin is warm and dry. No rash noted. She is not diaphoretic. No erythema.  Psychiatric: She has a normal mood and affect. Her behavior is normal.  Well groomed, good eye contact. Persistent anxious appearance, does have some labile mood with tearful episode discussing her son, speech and thoughts are normal  Nursing note and vitals reviewed.   Results for orders placed or performed in visit on 08/09/16  Hemoglobin A1c  Result Value Ref Range   Hgb A1c MFr Bld 8.5 (H) <5.7 %   Mean Plasma Glucose 197 mg/dL      Assessment & Plan:   Problem List Items Addressed This Visit    Uncontrolled diabetes mellitus type 2 without complications (HCC) - Primary    Continued significant worsening control with A1c 8.5 (previously, worsening trend 6 to 7 now 8) over past >6 No complications or hypoglycemia  Plan:  1. Emphasized importance of need to improve lifestyle cannot only manage with oral medications alone, now given inc A1c, recommend next level med added, offered SGLT2 and GLP1, due to her severe needle phobia she declines GLP1, but will consider this in future if absolutely needed, may consider a weekly dose. She has failed Invokana in past due to lack of ins coverage, she will check coverage for [place] or Jardiance as other oral options 2. Continue Metformin 1500mg  daily - 1000mg  in AM / 500mg  nightly 3. Offered referral to nutritionist [facility], will notify us if need referral 4. Weight management options discussed - limited benefit in only doing medication and no lifestyle changes, discussion today, consider self referral to [facility] for monitored wt management, Dr Quillian Quince for obesity wt management in [facility], vs bariatric surgical referral 4.  Overdue for ophthalmology exam - patient declines due to financial reasons 5. Follow-up q 3 months, DM A1c      Body mass index (BMI) of 45.0-49.9 in adult [facility])    Recent wt fluctuation, overall BMI >44, concern  for affecting his A1c, BP, and other risk factors - Chronic concern with stress contributing to wt gain  Plan: 1. Need to start sGLT2 vs GLP1 for DM with some wt loss component 2. Offered referral to nutritionist [facility], will notify us if need referral 3. Weight management options discussed - limited benefit in only doing medication and no lifestyle changes, discussion today, consider self referral to [facility] for monitored wt management, Dr Quillian Quince for obesity wt management in [facility], vs bariatric surgical referral       Other Visit Diagnoses    Other acute nonsuppurative otitis media of left ear, recurrence not specified     Consistent with L AOM on exam without effusion or perforation, in setting of worsening allergic rhinosinusitis and possible URI symptoms pharyngitis, with sick contact - No recent URI or AOM. Afebrile  Plan: 1. Start Amoxicillin 500mg  BID for 7 days 2. Start Tessalon Perls take 1 capsule up to 3 times a day as needed for cough 3. Continue allergy therapy as well 4. Improve hydration, regular diet as tolerated 5. Return criteria given      Relevant Medications  amoxicillin (AMOXIL) 500 MG capsule      Meds ordered this encounter  Medications  . mometasone (NASONEX) 50 MCG/ACT nasal spray    Refill:  0  . DISCONTD: metFORMIN (GLUCOPHAGE) 500 MG tablet    Refill:  10  . amoxicillin (AMOXIL) 500 MG capsule    Sig: Take 1 capsule (500 mg total) by mouth 2 (two) times daily. For 7 days    Dispense:  14 capsule    Refill:  0  . benzonatate (TESSALON) 100 MG capsule    Sig: Take 1 capsule (100 mg total) by mouth 3 (three) times daily as needed for cough.    Dispense:  30 capsule    Refill:  0      Follow up plan: Return in about 3 months (around 11/16/2016) for diabetes.  Saralyn PilarAlexander Toretto Tingler, DO [facility] 08/16/2016, 11:08 PM

## 2016-08-16 NOTE — Patient Instructions (Signed)
Thank you for coming to the clinic today.  1.  For Ear Infection take Amoxicillin for 1 week twice daily Start Tessalon Perls take 1 capsule up to 3 times a day as needed for cough Increase fluids Start anti-histamine pill OTC Loratadine (Claritin) or Cetirizine (Zyrtec) once daily, Continue Flonase for several week  -------   For Diabetes, I would like to start you on a new oral medication, SGLT2 inhibitor, once daily pill for lowering blood sugar by passing it through your urine. Very good medications can reduce A1c, BP, and weight loss. Uncommon side effects involve urinary tract infection or yeast infections due to blood sugar in urine. Rarely on Invokana there is an association with diabetic limb amputation due to blood flow, however the others Barbara Bird not carry this known risk.  Before next apt, please look into the insurance coverage for the following options:  - Farxiga (Dapagliflozin) - oral med  -  empagliflozin (Jardiance) - canagliflozin (Invokana)  Preferred is #1 Barbara Bird - we can get manufacturer coupon that may help coverage  Others, injectable as discussed below  - Bydureon BCise (Exenatide ER) - weekly injectable  - Trulicity (Dulaglutide) - weekly injectable  - Victoza (Liraglutide) - daily injectable   Let me know before next visit if any of these options will work for you and we can phone into your pharmacy ----------------------------------------------   [facility]  (Nutritionist only) Address: [address] [place], [place], [place] [address] Phone: [phone]  ----------------------------------------------------------- Call to find out availability of appointment and insurance coverage and what options are.  Barbara Quince, [facility] (Obesity Specialist - comprehensive weight management / nutrition and other services, also uses weight loss medications) Phone: [phone]  ---------------------------------------------  Self  Referral Barbara Bird not need appointment - Medication Assisted Weight Loss through local OB GYN office  [facility] [address], [address] [place], [place] [address] Hours: 8am - 5pm Main: [phone]  Barbara Ines Bloomer Aura Camps), RN CNM, midwife  Weight Loss Program (BMI >27, obesity w/o co-morbidities) - Will check thyroid studies - B12 injection - Phentermine ---------------------------  Eat at least 3 meals and 1-2 snacks per day (don't skip breakfast).  Aim for no more than 5 hours between eating. - Tip: If you go >5 hours without eating and become very hungry, your body will supply it's own resources temporarily and you can gain extra weight when you eat.  The 5 Minute Rule of Exercise - Promise yourself to at least Barbara Bird 5 minutes of exercise (make sure you time it), and if at the end of 5 minutes (this is the hardest part of the work-out), if you still feel like you want stop (or not motivated to continue) then allow yourself to stop. Otherwise, more often than not you will feel encouraged that you can continue for a little while longer or even more!  Diet Recommendations for Weight Management  REDUCE Starchy (carb) foods include: Bread, rice, pasta, potatoes, corn, crackers, bagels, muffins, all baked goods.   Continue Protein foods include: Meat, fish, poultry, eggs, dairy foods, and beans such as pinto and kidney beans (beans also provide carbohydrate).   1. Eat at least 3 meals and 1-2 snacks per day. Never go more than 4-5 hours while awake without eating.   2. Limit starchy foods to TWO per meal and ONE per snack. ONE portion of a starchy  food is equal to the following:              -  ONE slice of bread (or its equivalent, such as half of a hamburger bun).              - 1/2 cup of a "scoopable" starchy food such as potatoes or rice.              - 1 OUNCE (28 grams) of starchy snacks (crackers or pretzels, look on label).              - 15 grams of  carbohydrate as shown on food label.   3. Both lunch and dinner should include a protein food, a carb food, and vegetables.              - Obtain twice as many veg's as protein or carbohydrate foods for both lunch and dinner.              - Try to keep frozen veg's on hand for a quick vegetable serving.                - Fresh or frozen veg's are best.   4. Breakfast should always include protein.    Please schedule a follow-up appointment with Barbara Bird in 3 months for DM A1c  If you have any other questions or concerns, please feel free to call the clinic or send a message through MyChart. You may also schedule an earlier appointment if necessary.  Barbara PilarAlexander Karamalegos, Barbara Bird [facility]

## 2016-08-16 NOTE — Assessment & Plan Note (Signed)
Continued significant worsening control with A1c 8.5 (previously, worsening trend 6 to 7 now 8) over past >6 No complications or hypoglycemia  Plan:  1. Emphasized importance of need to improve lifestyle cannot only manage with oral medications alone, now given inc A1c, recommend next level med added, offered SGLT2 and GLP1, due to her severe needle phobia she declines GLP1, but will consider this in future if absolutely needed, may consider a weekly dose. She has failed Invokana in past due to lack of ins coverage, she will check coverage for [place] or Jardiance as other oral options 2. Continue Metformin 1500mg  daily - 1000mg  in AM / 500mg  nightly 3. Offered [facility], will notify us if need referral 4. Weight management options discussed - limited benefit in only doing medication and no lifestyle changes, discussion today, [facility], Dr Quillian Quincearen Beasley for obesity wt management in [facility], vs bariatric surgical referral 4.  Overdue for ophthalmology exam - patient declines due to financial reasons 5. Follow-up q 3 months, DM A1c

## 2016-08-16 NOTE — Assessment & Plan Note (Signed)
Recent wt fluctuation, overall BMI >44, concern for affecting his A1c, BP, and other risk factors - Chronic concern with stress contributing to wt gain  Plan: 1. Need to start sGLT2 vs GLP1 for DM with some wt loss component 2. Offered [facility], will notify us if need referral 3. Weight management options discussed - limited benefit in only doing medication and no lifestyle changes, discussion today, [facility], Dr Quillian Quincearen Beasley [place] obesity [facility] [place] [facility] [place], vs bariatric surgical referral

## 2016-12-15 ENCOUNTER — Other Ambulatory Visit: Payer: Self-pay | Admitting: Family Medicine

## 2016-12-15 DIAGNOSIS — M549 Dorsalgia, unspecified: Secondary | ICD-10-CM

## 2016-12-15 DIAGNOSIS — G8929 Other chronic pain: Secondary | ICD-10-CM

## 2016-12-15 DIAGNOSIS — M5116 Intervertebral disc disorders with radiculopathy, lumbar region: Secondary | ICD-10-CM

## 2017-02-16 ENCOUNTER — Other Ambulatory Visit: Payer: Self-pay | Admitting: Family Medicine

## 2017-02-16 DIAGNOSIS — E119 Type 2 diabetes mellitus without complications: Secondary | ICD-10-CM

## 2017-02-16 DIAGNOSIS — I10 Essential (primary) hypertension: Secondary | ICD-10-CM

## 2017-02-16 MED ORDER — METFORMIN HCL 1000 MG PO TABS: ORAL_TABLET | ORAL | 5 refills | Status: DC

## 2017-02-17 ENCOUNTER — Other Ambulatory Visit: Payer: Self-pay | Admitting: Family Medicine

## 2017-02-17 DIAGNOSIS — E785 Hyperlipidemia, unspecified: Principal | ICD-10-CM

## 2017-02-17 DIAGNOSIS — E1169 Type 2 diabetes mellitus with other specified complication: Secondary | ICD-10-CM

## 2017-07-28 LAB — HM DIABETES EYE EXAM

## 2017-08-01 ENCOUNTER — Other Ambulatory Visit: Payer: Self-pay | Admitting: Family Medicine

## 2017-08-01 DIAGNOSIS — I10 Essential (primary) hypertension: Secondary | ICD-10-CM

## 2017-08-01 DIAGNOSIS — E785 Hyperlipidemia, unspecified: Principal | ICD-10-CM

## 2017-08-01 DIAGNOSIS — E1169 Type 2 diabetes mellitus with other specified complication: Secondary | ICD-10-CM

## 2017-08-01 DIAGNOSIS — E119 Type 2 diabetes mellitus without complications: Secondary | ICD-10-CM

## 2017-08-02 ENCOUNTER — Other Ambulatory Visit: Payer: Self-pay | Admitting: Family Medicine

## 2017-08-02 DIAGNOSIS — M5116 Intervertebral disc disorders with radiculopathy, lumbar region: Secondary | ICD-10-CM

## 2017-08-02 DIAGNOSIS — M549 Dorsalgia, unspecified: Secondary | ICD-10-CM

## 2017-08-02 DIAGNOSIS — G8929 Other chronic pain: Secondary | ICD-10-CM

## 2017-08-24 ENCOUNTER — Other Ambulatory Visit: Payer: Self-pay | Admitting: Family Medicine

## 2017-08-24 DIAGNOSIS — E1169 Type 2 diabetes mellitus with other specified complication: Secondary | ICD-10-CM

## 2017-08-24 DIAGNOSIS — E119 Type 2 diabetes mellitus without complications: Secondary | ICD-10-CM

## 2017-08-24 DIAGNOSIS — I10 Essential (primary) hypertension: Secondary | ICD-10-CM

## 2017-08-24 DIAGNOSIS — E785 Hyperlipidemia, unspecified: Secondary | ICD-10-CM

## 2017-09-17 ENCOUNTER — Other Ambulatory Visit: Payer: Self-pay | Admitting: Family Medicine

## 2017-09-17 DIAGNOSIS — E785 Hyperlipidemia, unspecified: Principal | ICD-10-CM

## 2017-09-17 DIAGNOSIS — E1169 Type 2 diabetes mellitus with other specified complication: Secondary | ICD-10-CM

## 2017-09-17 DIAGNOSIS — E119 Type 2 diabetes mellitus without complications: Secondary | ICD-10-CM

## 2017-09-17 DIAGNOSIS — I10 Essential (primary) hypertension: Secondary | ICD-10-CM

## 2017-10-09 ENCOUNTER — Other Ambulatory Visit: Payer: Self-pay | Admitting: Family Medicine

## 2017-10-09 DIAGNOSIS — M549 Dorsalgia, unspecified: Secondary | ICD-10-CM

## 2017-10-09 DIAGNOSIS — G8929 Other chronic pain: Secondary | ICD-10-CM

## 2017-10-09 DIAGNOSIS — M5116 Intervertebral disc disorders with radiculopathy, lumbar region: Secondary | ICD-10-CM

## 2017-10-12 ENCOUNTER — Ambulatory Visit (INDEPENDENT_AMBULATORY_CARE_PROVIDER_SITE_OTHER): Payer: BLUE CROSS/BLUE SHIELD | Admitting: Family Medicine

## 2017-10-12 ENCOUNTER — Encounter: Payer: Self-pay | Admitting: Family Medicine

## 2017-10-12 VITALS — BP 141/87 | HR 86 | Temp 98.7°F | Resp 16 | Ht 63.0 in | Wt 248.0 lb

## 2017-10-12 DIAGNOSIS — E1169 Type 2 diabetes mellitus with other specified complication: Secondary | ICD-10-CM

## 2017-10-12 DIAGNOSIS — G8929 Other chronic pain: Secondary | ICD-10-CM | POA: Diagnosis not present

## 2017-10-12 DIAGNOSIS — Z6841 Body Mass Index (BMI) 40.0 and over, adult: Secondary | ICD-10-CM

## 2017-10-12 DIAGNOSIS — R635 Abnormal weight gain: Secondary | ICD-10-CM | POA: Diagnosis not present

## 2017-10-12 DIAGNOSIS — M5440 Lumbago with sciatica, unspecified side: Secondary | ICD-10-CM | POA: Diagnosis not present

## 2017-10-12 DIAGNOSIS — K580 Irritable bowel syndrome with diarrhea: Secondary | ICD-10-CM | POA: Diagnosis not present

## 2017-10-12 DIAGNOSIS — I10 Essential (primary) hypertension: Secondary | ICD-10-CM | POA: Diagnosis not present

## 2017-10-12 DIAGNOSIS — A6000 Herpesviral infection of urogenital system, unspecified: Secondary | ICD-10-CM | POA: Diagnosis not present

## 2017-10-12 DIAGNOSIS — E785 Hyperlipidemia, unspecified: Secondary | ICD-10-CM

## 2017-10-12 DIAGNOSIS — Z1239 Encounter for other screening for malignant neoplasm of breast: Secondary | ICD-10-CM

## 2017-10-12 DIAGNOSIS — Z1231 Encounter for screening mammogram for malignant neoplasm of breast: Secondary | ICD-10-CM | POA: Diagnosis not present

## 2017-10-12 MED ORDER — METFORMIN HCL 500 MG PO TABS: 500.0000 mg | ORAL_TABLET | Freq: Every day | ORAL | 3 refills | Status: DC

## 2017-10-12 MED ORDER — ATORVASTATIN CALCIUM 10 MG PO TABS: 10.0000 mg | ORAL_TABLET | Freq: Every day | ORAL | 3 refills | Status: DC

## 2017-10-12 MED ORDER — LISINOPRIL-HYDROCHLOROTHIAZIDE 20-12.5 MG PO TABS: 1.0000 | ORAL_TABLET | Freq: Every day | ORAL | 3 refills | Status: DC

## 2017-10-12 MED ORDER — DICYCLOMINE HCL 10 MG PO CAPS: 10.0000 mg | ORAL_CAPSULE | Freq: Three times a day (TID) | ORAL | 0 refills | Status: DC

## 2017-10-12 MED ORDER — METFORMIN HCL 1000 MG PO TABS: 1000.0000 mg | ORAL_TABLET | Freq: Every day | ORAL | 3 refills | Status: DC

## 2017-10-12 MED ORDER — GABAPENTIN 300 MG PO CAPS: 600.0000 mg | ORAL_CAPSULE | Freq: Two times a day (BID) | ORAL | 3 refills | Status: DC

## 2017-10-12 MED ORDER — VALACYCLOVIR HCL 1 G PO TABS: 1000.0000 mg | ORAL_TABLET | Freq: Two times a day (BID) | ORAL | 2 refills | Status: DC

## 2017-10-12 NOTE — Patient Instructions (Addendum)
Thank you for coming to the office today.  Check with [facility] to request "Diabetic Eye Exam Report" have it faxed to [place]  ------------------  Consider Pain Management - check with insurance to see cost  [facility] Address: [place], [place], [place] [address] Phone: [phone]  For Mammogram screening for breast cancer   Call the Dane below anytime to schedule your own appointment now that order has been placed.  [facility] [place], [place] [address] Phone: [phone] -------------------------------------------------------  You may be eligible to participate in some free health screening programs. ------------------------------------- Consider [facility] in [place] for free lab screening  [email or web address]  ----------------------------------------  [email or web address]  Also offers genetic risk assessment  Breast and Cervical Cancer Control Program (BCCCP): Women who are uninsured or underinsured may be eligible to receive a free mammogram and pap smear through the Breast and Cervical Cancer Control Program (BCCCP). For more information about eligibility for this program, call [phone]  ---------------------------------------  Colon Cancer Screening: - For all adults age 50+ routine colon cancer screening is highly recommended.     - Recent guidelines from [employer] recommend starting age of 74 - Early detection of colon cancer is important, because often there are no warning signs or symptoms, also if found early usually it can be cured. Late stage is hard to treat.  - If you are not interested in Colonoscopy screening (if done and normal you could be cleared for 5 to 10 years until next due), then Cologuard is an excellent alternative for screening test for Colon Cancer. It  is highly sensitive for detecting DNA of colon cancer from even the earliest stages. Also, there is NO bowel prep required. - If Cologuard is NEGATIVE, then it is good for 3 years before next due - If Cologuard is POSITIVE, then it is strongly advised to get a Colonoscopy, which allows the GI doctor to locate the source of the cancer or polyp (even very early stage) and treat it by removing it. ------------------------- If you would like to proceed with Cologuard (stool DNA test) - FIRST, call your insurance company and tell them you want to check cost of Cologuard tell them CPT Code [phone] (it may be completely covered and you could get for no cost, OR max cost without any coverage is about $600). Also, keep in mind if you do NOT open the kit, and decide not to do the test, you will NOT be charged, you should contact the company if you decide not to do the test. - If you want to proceed, you can notify us (phone message, [place], or at next visit) and we will order it for you. The test kit will be delivered to you house within about 1 week. Follow instructions to collect sample, you may call the company for any help or questions, 24/7 telephone support at [phone].  ----------------------------- For likely IBS or Diarrhea  For diarrhea may try Imodium OTC  May take rx Dicyclomine as needed for abdominal cramping and spasms and diarrhea episode  Other more natural remedies or preventative treatment: - Increase hydration with water - Increase fiber in diet (high fiber foods = vegetables, leafy greens, oats/grains) - May take OTC Fiber supplement (metamucil powder or pill/gummy) - May try OTC Probiotic  OTC Peppermint Oil (Triple Coated Capsule) 145m take one 3 times daily to reduce diarrhea   Call insurance find cost and  coverage of the following  1. Ozempic (Semaglutide injection) - start 0.74m weekly for 4 weeks then increase to 0.520mweekly - This one has best benefit of  weight loss and reducing Cardiovascular events  2. Bydureon BCise (Exenatide ER) - once weekly - this is my preference, very good medicine well tolerated, less side effects of nausea, upset stomach. No dose changes. Cost and coverage is the problem, but we may be able to get it with the coupon card  3. Trulicity (Dulaglutide) - once weekly - this is very good one, usually one of my top choices as well, two doses, 0.75 (likely we would start) and 1.5 max dose. We can use coupon card here too  4. Victoza (Liraglutide) - once DAILY - 3 dose changes 0.6, 1.2 and 1.8, side effects nausea, upset stomach higher on this one but it is still very effective medicine   Please schedule a Follow-up Appointment to: Return in about 6 months (around 04/14/2018) for DM A1c.  If you have any other questions or concerns, please feel free to call the office or send a message through [facility]You may also schedule an earlier appointment if necessary.  Additionally, you may be receiving a survey about your experience at our office within a few days to 1 week by e-mail or mail. We value your feedback.  AlNobie PutnamDO SoHillman
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
## 2017-10-13 LAB — LIPID PANEL
CHOL/HDL RATIO: 4.8 (calc) (ref <5.0)
Cholesterol: 163 mg/dL (ref <200)
HDL: 34 mg/dL — ABNORMAL LOW (ref >50)
NON-HDL CHOLESTEROL (CALC): 129 mg/dL (ref <130)
TRIGLYCERIDES: 418 mg/dL — AB (ref <150)

## 2017-10-13 LAB — CBC WITH DIFFERENTIAL/PLATELET
BASOS PCT: 0.8 %
Basophils Absolute: 74 cells/uL (ref 0–200)
EOS ABS: 177 {cells}/uL (ref 15–500)
EOS PCT: 1.9 %
HCT: 44.1 % (ref 35.0–45.0)
HEMOGLOBIN: 14.7 g/dL (ref 11.7–15.5)
Lymphs Abs: 3181 cells/uL (ref 850–3900)
MCH: 31.2 pg (ref 27.0–33.0)
MCHC: 33.3 g/dL (ref 32.0–36.0)
MCV: 93.6 fL (ref 80.0–100.0)
MPV: 9.7 fL (ref 7.5–12.5)
Monocytes Relative: 6.1 %
NEUTROS ABS: 5301 {cells}/uL (ref 1500–7800)
Neutrophils Relative %: 57 %
Platelets: 309 10*3/uL (ref 140–400)
RBC: 4.71 10*6/uL (ref 3.80–5.10)
RDW: 12.6 % (ref 11.0–15.0)
Total Lymphocyte: 34.2 %
WBC mixed population: 567 cells/uL (ref 200–950)
WBC: 9.3 10*3/uL (ref 3.8–10.8)

## 2017-10-13 LAB — COMPLETE METABOLIC PANEL WITH GFR
AG RATIO: 2.1 (calc) (ref 1.0–2.5)
ALT: 21 U/L (ref 6–29)
AST: 10 U/L (ref 10–35)
Albumin: 4.6 g/dL (ref 3.6–5.1)
Alkaline phosphatase (APISO): 96 U/L (ref 33–130)
BILIRUBIN TOTAL: 0.5 mg/dL (ref 0.2–1.2)
BUN: 13 mg/dL (ref 7–25)
CO2: 25 mmol/L (ref 20–32)
Calcium: 9.4 mg/dL (ref 8.6–10.4)
Chloride: 101 mmol/L (ref 98–110)
Creat: 0.67 mg/dL (ref 0.50–1.05)
GFR, EST NON AFRICAN AMERICAN: 102 mL/min/{1.73_m2} (ref >=60)
GFR, Est African American: 119 mL/min/{1.73_m2} (ref >=60)
GLOBULIN: 2.2 g/dL (ref 1.9–3.7)
Glucose, Bld: 204 mg/dL — ABNORMAL HIGH (ref 65–99)
Potassium: 4.3 mmol/L (ref 3.5–5.3)
Sodium: 138 mmol/L (ref 135–146)
Total Protein: 6.8 g/dL (ref 6.1–8.1)

## 2017-10-13 LAB — HEMOGLOBIN A1C
EAG (MMOL/L): 11.4 (calc)
HEMOGLOBIN A1C: 8.8 %{Hb} — AB (ref <5.7)
MEAN PLASMA GLUCOSE: 206 (calc)

## 2017-10-13 LAB — TSH: TSH: 1.85 m[IU]/L

## 2017-10-14 ENCOUNTER — Other Ambulatory Visit: Payer: Self-pay | Admitting: Family Medicine

## 2017-10-14 DIAGNOSIS — G8929 Other chronic pain: Secondary | ICD-10-CM

## 2017-10-14 DIAGNOSIS — M5116 Intervertebral disc disorders with radiculopathy, lumbar region: Secondary | ICD-10-CM

## 2017-10-14 DIAGNOSIS — M549 Dorsalgia, unspecified: Secondary | ICD-10-CM

## 2017-10-27 ENCOUNTER — Ambulatory Visit (INDEPENDENT_AMBULATORY_CARE_PROVIDER_SITE_OTHER): Payer: BLUE CROSS/BLUE SHIELD | Admitting: Family Medicine

## 2017-10-27 ENCOUNTER — Encounter: Payer: Self-pay | Admitting: Family Medicine

## 2017-10-27 ENCOUNTER — Other Ambulatory Visit: Payer: Self-pay

## 2017-10-27 DIAGNOSIS — Z0000 Encounter for general adult medical examination without abnormal findings: Secondary | ICD-10-CM

## 2017-10-27 NOTE — Progress Notes (Signed)
Patient presented for scheduled apt today however she was just recently seen 10/12/17 for comprehensive visit including chronic disease management and wellness counseling, she had labs ordered at that time that were resulted after 10/12/17, her results were released to her mychart, however she did not have access to it with login difficulty, therefore she did not get results. She was advised to schedule in 6 months, however she had the apt already scheduled and presented today. She has no new medical complaint or concern. She requested her lab results. She was notified of her lab results and given print out of this information. She was given password access to her mychart to reset and should keep her next apt in 6 months to discuss her Diabetes and new medications that she was advised of at her last apt 10/12/17.  Removed initial vital signs, and no physical exam was performed today.  Saralyn PilarAlexander Karamalegos, DO [facility] 10/27/2017, 2:54 PM

## 2018-04-14 ENCOUNTER — Ambulatory Visit: Payer: BLUE CROSS/BLUE SHIELD | Admitting: Family Medicine

## 2018-04-14 ENCOUNTER — Encounter: Payer: Self-pay | Admitting: Family Medicine

## 2018-04-14 VITALS — BP 127/73 | HR 101 | Temp 98.1°F | Resp 16 | Ht 63.0 in | Wt 247.0 lb

## 2018-04-14 DIAGNOSIS — Z6841 Body Mass Index (BMI) 40.0 and over, adult: Secondary | ICD-10-CM

## 2018-04-14 DIAGNOSIS — M5440 Lumbago with sciatica, unspecified side: Secondary | ICD-10-CM | POA: Diagnosis not present

## 2018-04-14 DIAGNOSIS — G8929 Other chronic pain: Secondary | ICD-10-CM

## 2018-04-14 DIAGNOSIS — E1169 Type 2 diabetes mellitus with other specified complication: Secondary | ICD-10-CM | POA: Diagnosis not present

## 2018-04-14 DIAGNOSIS — M25511 Pain in right shoulder: Secondary | ICD-10-CM | POA: Insufficient documentation

## 2018-04-14 DIAGNOSIS — M25512 Pain in left shoulder: Secondary | ICD-10-CM

## 2018-04-14 DIAGNOSIS — M5116 Intervertebral disc disorders with radiculopathy, lumbar region: Secondary | ICD-10-CM

## 2018-04-14 LAB — POCT GLYCOSYLATED HEMOGLOBIN (HGB A1C): Hemoglobin A1C: 8.8 % — AB (ref 4.0–5.6)

## 2018-04-14 NOTE — Progress Notes (Signed)
Subjective:    Patient ID: Barbara Bird, female    DOB: [birth year], 53 y.o.   MRN: [id number]  Barbara Bird is a 53 y.o. female presenting on 04/14/2018 for Diabetes   HPI   CHRONIC DM, Type 2: Last A1c 8.8 on 10/12/17 CBGs: Not checking CBG Meds: Metformin 1000mg  AM / 500mg  PM Reports good compliance. 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 - however she states not interested in returning, limited relief, and unable due to high cost She takes variety of medications including Aleve, NSAID, Gabapentin 300mg  TID, Tumeric, CBD gummies - She has decided against Orthopedics or Pain Management 2nd opinion   Health Maintenance:  Due pneumonia vaccine - pneumovax-23 - 1st dose < age 21, with diabetes.  Breast CA Screening: Due for mammogram screening. No prior mammogram result on file. She has declined screening in past. She is worried about cost. No prior history abnormal mammogram. No known family history of breast cancer. Currently asymptomatic.  Cervical CA Screening: Overdue for pap smear. She will check cost / coverage  Colon CA Screening: Never had colonoscopy or other screening. She is concerned about cost and coverage of screening. Currently symptoms with IBS and diarrhea and fecal incontinence that are chronic and unrelated to request for screening. No known family history of colon  CA. Due for screening test will check into insurance cost for colonoscopy vs cologuard.  Depression screen [facility] 2/9 04/14/2018 10/12/2017 02/24/2015  Decreased Interest 0 0 0  Down, Depressed, Hopeless 0 0 0  PHQ - 2 Score 0 0 0  Altered sleeping - - -  Tired, decreased energy - - -  Change in appetite - - -  Feeling bad or failure about yourself  - - -  Trouble concentrating - - -  Moving slowly or fidgety/restless - - -  Suicidal thoughts - - -  PHQ-9 Score - - -  Difficult doing work/chores - - -    Social History   Tobacco Use  . Smoking status: Never Smoker  . Smokeless tobacco: Never Used  Substance Use Topics  . Alcohol use: Yes    Comment: occasional  . Drug use: No    Review of Systems Per HPI unless specifically indicated above     Objective:    BP 127/73   Pulse (!) 101   Temp 98.1 F (36.7 C) (Oral)   Resp 16   Ht 5\' 3"  (1.6 m)   Wt 247 lb (112 kg)   BMI 43.75 kg/m   Wt Readings from Last 3 Encounters:  04/14/18 247 lb (112 kg)  10/12/17 248 lb (112.5 kg)  08/16/16 249 lb (112.9 kg)    Physical Exam Vitals signs and nursing note reviewed.  Constitutional:      General: She is not in acute distress.    Appearance: She is well-developed. She is not diaphoretic.     Comments: Well-appearing, uncomfortable,  cooperative, obese  HENT:     Head: Normocephalic and atraumatic.  Eyes:     General:        Right eye: No discharge.        Left eye: No discharge.     Conjunctiva/sclera: Conjunctivae normal.  Cardiovascular:     Rate and Rhythm: Normal rate.  Pulmonary:     Effort: Pulmonary effort is normal.  Musculoskeletal:     Comments: Bilateral shoulders limited range of motion, forward flexion unable to significantly lift both above shoulder length  Skin:    General: Skin is warm and dry.     Findings: No erythema or rash.  Neurological:     Mental Status: She is alert and oriented to person, place, and time.  Psychiatric:        Behavior:  Behavior normal.     Comments: Well groomed, good eye contact, normal speech and thoughts    Recent Labs    10/12/17 0000 04/14/18 0832  HGBA1C 8.8* 8.8*    Results for orders placed or performed in visit on 04/14/18  POCT HgB A1C  Result Value Ref Range   Hemoglobin A1C 8.8 (A) 4.0 - 5.6 %      Assessment & Plan:   Problem List Items Addressed This Visit    Back pain, chronic Neuritis or radiculitis due to rupture of lumbar intervertebral disc   Bilateral shoulder pain Persistent chronic debilitating pain Known OA/DJD multiple joints spine, also likely arthritis vs rotator cuff tendonitis for shoulders limiting ROM  Plan Advised that she should return to [facility] for advanced therapy options, given already failed most medication management options and conservative options. She had limited improvement on injections in the past - she will defer this for now, limited by barrier of cost for diagnostics/imaging and intervention  - Offered shoulder subacromial joint space injections to help improve mobility - she will consider this and likely return after checking cost of injections.  Recommended charity care through a larger setting such as [facility] for wider access to specialist covered, given significant financial barriers to her receiving care.    Morbid obesity with BMI of 40.0-44.9, adult (HCC) Encourage improve diet changes for wt loss Limited exercise mobility       Type 2 diabetes mellitus with other specified complication (HCC) - Primary  Suboptimal control A1c 8.8 With hyperglycemia Complications - peripheral neuropathy, other including hyperlipidemia, GERD, depression, obesity - increases risk of future cardiovascular complications  Plan:  1. Continue current therapy - Metformin - Offered GLP1 agent, she has needle phobia - but will consider Bydureon / Trulicity if able to get at affordable cost w/ insurance coverage and copay card 2. Encourage  improved lifestyle - low carb, low sugar diet, reduce portion size, continue improving regular exercise 3. Check CBG, bring log to next visit for review 4. Continue ACEi, Statin 5. Follow-up 6 months     Relevant Orders   POCT HgB A1C (Completed)      No orders of the defined types were placed in this encounter.   Follow up plan: Return in about 6 months (around 10/13/2018) for 6 months Diabetes, Arthritis, Pain.  Saralyn PilarAlexander Karamalegos, DO [facility] 04/14/2018, 8:10 AM

## 2018-04-14 NOTE — Patient Instructions (Addendum)
Thank you for coming to the office today.  Consider Shoulder Joint Injection - CPT Code 20610 - ask about cost for 1 shoulder vs both  Look into [facility] - this may be an option for future, possibly next year if you decide to not renew insurance and may proceed with this application.  Uninsured - other option [place]   Address: [address], [place] [address] Hours: Tues 4:15PM-8PM // Weds 9am - 12pm // Thurs 1pm - 8pm (Closed other days) Phone: [phone] -----------------------------------------  You may be eligible to participate in some free health screening programs. ------------------------------------- Consider [facility] in [place] for free lab screening  [email or web address]  ----------------------------------------------------  [email or web address]  Also offers genetic risk assessment  Breast and Cervical Cancer Control Program (BCCCP): Women who are uninsured or underinsured may be eligible to receive a free mammogram and pap smear through the Breast and Cervical Cancer Control Program (BCCCP). For more information about eligibility for this program, call [phone]  ---------------------------------------------------  Colon Cancer Screening: - For all adults age 78+ routine colon cancer screening is highly recommended.     - Recent guidelines from [place] recommend starting age of 48 - Early detection of colon cancer is important, because often there are no warning signs or symptoms, also if found early usually it can be cured. Late stage is hard to treat.  - If you are not interested in Colonoscopy screening (if done and normal you could be cleared for 5 to 10 years until next due), then Cologuard is an excellent alternative for screening test for Colon Cancer. It is highly sensitive for  detecting DNA of colon cancer from even the earliest stages. Also, there is NO bowel prep required. - If Cologuard is NEGATIVE, then it is good for 3 years before next due - If Cologuard is POSITIVE, then it is strongly advised to get a Colonoscopy, which allows the GI doctor to locate the source of the cancer or polyp (even very early stage) and treat it by removing it. ------------------------- If you would like to proceed with Cologuard (stool DNA test) - FIRST, call your insurance company and tell them you want to check cost of Cologuard tell them CPT Code [phone] (it may be completely covered and you could get for no cost, OR max cost without any coverage is about $600). Also, keep in mind if you do NOT open the kit, and decide not to do the test, you will NOT be charged, you should contact the company if you decide not to do the test. - If you want to proceed, you can notify us (phone message, [place], or at next visit) and we will order it for you. The test kit will be delivered to you house within about 1 week. Follow instructions to collect sample, you may call the company for any help or questions, 24/7 telephone support at [phone].  ---------------------------------  Call insurance find cost and coverage of the following  Non visible needle - auto injector  Bydureon BCise (Exenatide ER) - once weekly - this is my preference, very good medicine well tolerated, less side effects of nausea, upset stomach. No dose changes. Cost and coverage is the problem, but we may be able to get it with the coupon card  Trulicity (Dulaglutide) - once weekly - this is very good one, usually one of my top choices as well, two doses, 0.75 (likely  we would start) and 1.5 max dose. We can use coupon card here too   -------------------------  These have an actual visible needle  Ozempic (Semaglutide injection) - start 0.63m weekly for 4 weeks then increase to 0.549mweekly - This one has  best benefit of weight loss and reducing Cardiovascular events  Victoza (Liraglutide) - once DAILY - 3 dose changes 0.6, 1.2 and 1.8, side effects nausea, upset stomach higher on this one but it is still very effective medicine    Please schedule a Follow-up Appointment to: Return in about 6 months (around 10/13/2018) for 6 months Diabetes, Arthritis, Pain.  If you have any other questions or concerns, please feel free to call the office or send a message through [facility] may also schedule an earlier appointment if necessary.  Additionally, you may be receiving a survey about your experience at our office within a few days to 1 week by e-mail or mail. We value your feedback.  AlNobie PutnamDO [facility]

## 2018-06-09 ENCOUNTER — Telehealth: Payer: Self-pay | Admitting: Nurse Practitioner

## 2018-06-09 DIAGNOSIS — M5136 Other intervertebral disc degeneration, lumbar region: Secondary | ICD-10-CM

## 2018-06-09 DIAGNOSIS — G8929 Other chronic pain: Secondary | ICD-10-CM

## 2018-06-09 DIAGNOSIS — M5440 Lumbago with sciatica, unspecified side: Secondary | ICD-10-CM

## 2018-06-09 NOTE — Telephone Encounter (Signed)
Pt tried a friend's diclofenac soler tabs 75 mg and it really helped her back pain.  Please call [phone]

## 2018-06-12 MED ORDER — DICLOFENAC SODIUM 75 MG PO TBEC: 75.0000 mg | DELAYED_RELEASE_TABLET | Freq: Two times a day (BID) | ORAL | 2 refills | Status: DC | PRN

## 2018-06-12 NOTE — Telephone Encounter (Signed)
Ordered Diclofenac, take one with food twice a day as needed for trial of anti inflammatory.  Caution with regular use over many months to years can affect her kidneys.  Saralyn Pilar, DO [facility] 06/12/2018, 12:19 PM

## 2018-06-16 NOTE — Telephone Encounter (Signed)
Rx was send and left message.

## 2018-08-16 ENCOUNTER — Other Ambulatory Visit: Payer: Self-pay | Admitting: Family Medicine

## 2018-08-16 DIAGNOSIS — M5440 Lumbago with sciatica, unspecified side: Secondary | ICD-10-CM

## 2018-08-16 DIAGNOSIS — M5136 Other intervertebral disc degeneration, lumbar region: Secondary | ICD-10-CM

## 2018-08-16 DIAGNOSIS — G8929 Other chronic pain: Secondary | ICD-10-CM

## 2018-09-15 ENCOUNTER — Other Ambulatory Visit: Payer: Self-pay | Admitting: Family Medicine

## 2018-09-15 DIAGNOSIS — E1169 Type 2 diabetes mellitus with other specified complication: Secondary | ICD-10-CM

## 2018-09-15 DIAGNOSIS — G8929 Other chronic pain: Secondary | ICD-10-CM

## 2018-09-15 DIAGNOSIS — I10 Essential (primary) hypertension: Secondary | ICD-10-CM

## 2018-09-15 DIAGNOSIS — E785 Hyperlipidemia, unspecified: Secondary | ICD-10-CM

## 2018-10-16 ENCOUNTER — Other Ambulatory Visit: Payer: Self-pay

## 2018-10-16 ENCOUNTER — Ambulatory Visit (INDEPENDENT_AMBULATORY_CARE_PROVIDER_SITE_OTHER): Payer: BLUE CROSS/BLUE SHIELD | Admitting: Family Medicine

## 2018-10-16 ENCOUNTER — Encounter: Payer: Self-pay | Admitting: Family Medicine

## 2018-10-16 DIAGNOSIS — E1169 Type 2 diabetes mellitus with other specified complication: Secondary | ICD-10-CM | POA: Diagnosis not present

## 2018-10-16 NOTE — Assessment & Plan Note (Signed)
Previously elevated A1c 8.8, overdue, but not able to come in due to to coronavirus pandemic Complications - hyperlipidemia, obesity - increases risk of future cardiovascular complications   Plan:  1. Continue current therapy - Metformin 1000mg  AM / 500mg  PM - Will discuss future option of Rybelsus oral GLP1 agent in future when patient returns to office, she has declined  2. Encourage improved lifestyle - low carb, low sugar diet, reduce portion size, continue improving regular exercise 3. Continue ACEi, Statin 5. Due DM Foot exam / Advised to schedule DM ophtho exam, send record 6. Follow-up 4 months

## 2018-10-16 NOTE — Patient Instructions (Addendum)
Keep up good work overall.  No med changes  Most meds refilled on 09/15/18 - given 90 day + 1 refill  Due for A1c and other labs will get in few months.  Due for DM Eye Exam in future, try to schedule if you feel comfortable to do this.  Also due for Mammogram, can review in future.   DUE for FASTING BLOOD WORK (no food or drink after midnight before the lab appointment, only water or coffee without cream/sugar on the morning of)  SCHEDULE "Lab Only" visit in the morning at the clinic for lab draw in 4 MONTHS   - Make sure Lab Only appointment is at about 1 week before your next appointment, so that results will be available  For Lab Results, once available within 2-3 days of blood draw, you can can log in to MyChart online to view your results and a brief explanation. Also, we can discuss results at next follow-up visit.   Please schedule a Follow-up Appointment to: Return in about 4 months (around 02/16/2019) for Annual Physical.  If you have any other questions or concerns, please feel free to call the office or send a message through [place]. You may also schedule an earlier appointment if necessary.  Additionally, you may be receiving a survey about your experience at our office within a few days to 1 week by e-mail or mail. We value your feedback.  Barbara Putnam, DO [place]

## 2018-10-16 NOTE — Progress Notes (Signed)
Virtual Visit via Telephone The purpose of this virtual visit is to provide medical care while limiting exposure to the novel coronavirus (COVID19) for both patient and office staff.  Consent was obtained for phone visit:  Yes.   Answered questions that patient had about telehealth interaction:  Yes.   I discussed the limitations, risks, security and privacy concerns of performing an evaluation and management service by telephone. I also discussed with the patient that there may be a patient responsible charge related to this service. The patient expressed understanding and agreed to proceed.  Patient Location: Home [facility]  ---------------------------------------------------------------------- Chief Complaint  Patient presents with  . Diabetes    S: Reviewed CMA documentation. I have called patient and gathered additional HPI as follows:  CHRONIC DM, Type 2: Last A1c 8.8 on 04/2018 Doing well overall in setting of coronavirus pandemic CBGs:Not checking CBG Meds:Metformin 1000mg  AM / 500mg  PM Reports good compliance. Tolerating well w/o side-effects Currently on ACEi Lifestyle: - Diet (Improving DM diet - now with low carb diet) - Due for DM Eye exam, declines to go to their office at the moment, based on coronavirus risk Denies hypoglycemia, blurry vision, polyuria  Patient is currently trying to stay at home in isolation Denies any high risk travel to areas of current concern for COVID19. Denies any known or suspected exposure to person with or possibly with COVID19.  Denies any fevers, chills, sweats, body ache, cough, shortness of breath, sinus pain or pressure, headache, abdominal pain, diarrhea  Past Medical History:  Diagnosis Date  . Hypertension    Social History   Tobacco Use  . Smoking status: Never Smoker  . Smokeless tobacco: Never Used  Substance Use Topics  . Alcohol use: Yes    Comment: occasional  .  Drug use: No    Current Outpatient Medications:  .  atorvastatin (LIPITOR) 10 MG tablet, TAKE 1 TABLET BY MOUTH EVERY DAY, Disp: 90 tablet, Rfl: 1 .  diclofenac (VOLTAREN) 75 MG EC tablet, TAKE 1 TABLET (75 MG TOTAL) BY MOUTH 2 (TWO) TIMES DAILY AS NEEDED. TAKE WITH FOOD., Disp: 60 tablet, Rfl: 2 .  dicyclomine (BENTYL) 10 MG capsule, Take 1 capsule (10 mg total) by mouth 4 (four) times daily -  before meals and at bedtime., Disp: 40 capsule, Rfl: 0 .  gabapentin (NEURONTIN) 300 MG capsule, TAKE 2 CAPSULES (600 MG TOTAL) BY MOUTH 2 (TWO) TIMES DAILY., Disp: 360 capsule, Rfl: 1 .  ibuprofen (ADVIL,MOTRIN) 600 MG tablet, , Disp: , Rfl: 0 .  lisinopril-hydrochlorothiazide (ZESTORETIC) 20-12.5 MG tablet, TAKE 1 TABLET BY MOUTH EVERY DAY, Disp: 90 tablet, Rfl: 1 .  Menthol, Topical Analgesic, 10 % LIQD, , Disp: , Rfl:  .  metFORMIN (GLUCOPHAGE) 1000 MG tablet, TAKE 1 TABLET (1,000 MG TOTAL) BY MOUTH DAILY WITH BREAKFAST. TAKE WITH OTHER PILL 500MG  PER DAY (Patient taking differently: Take 1,000 mg by mouth daily with breakfast. ), Disp: 90 tablet, Rfl: 1 .  metFORMIN (GLUCOPHAGE) 500 MG tablet, TAKE 1 TABLET (500 MG TOTAL) BY MOUTH DAILY WITH SUPPER. TAKE WITH OTHER PILL 1000MG  PER DAY, Disp: 90 tablet, Rfl: 1 .  mometasone (NASONEX) 50 MCG/ACT nasal spray, , Disp: , Rfl: 0 .  carisoprodol (SOMA) 350 MG tablet, 1 po bid prn, Disp: , Rfl:  .  Nerve Stimulator (STANDARD TENS) DEVI, by Does not apply route., Disp: , Rfl:  .  valACYclovir (VALTREX) 1000 MG tablet, Take 1 tablet (1,000 mg total) by  mouth 2 (two) times daily. For 5-10 days as needed for HSV flare (Patient not taking: Reported on 10/16/2018), Disp: 10 tablet, Rfl: 2  Depression screen [facility] 2/9 10/16/2018 04/14/2018 10/12/2017  Decreased Interest 0 0 0  Down, Depressed, Hopeless 0 0 0  PHQ - 2 Score 0 0 0  Altered sleeping - - -  Tired, decreased energy - - -  Change in appetite - - -  Feeling bad or failure about yourself  - - -  Trouble  concentrating - - -  Moving slowly or fidgety/restless - - -  Suicidal thoughts - - -  PHQ-9 Score - - -  Difficult doing work/chores - - -    No flowsheet data found.  -------------------------------------------------------------------------- O: No physical exam performed due to remote telephone encounter.  Lab results reviewed.  No results found for this or any previous visit (from the past 2160 hour(s)).  -------------------------------------------------------------------------- A&P:  Problem List Items Addressed This Visit    Type 2 diabetes mellitus with other specified complication ([place]) - Primary    Previously elevated A1c 8.8, overdue, but not able to come in due to to coronavirus pandemic Complications - hyperlipidemia, obesity - increases risk of future cardiovascular complications   Plan:  1. Continue current therapy - Metformin 1000mg  AM / 500mg  PM - Will discuss future option of Rybelsus oral GLP1 agent in future when patient returns to office, she has declined  2. Encourage improved lifestyle - low carb, low sugar diet, reduce portion size, continue improving regular exercise 3. Continue ACEi, Statin 5. Due DM Foot exam / Advised to schedule DM ophtho exam, send record 6. Follow-up 4 months          No orders of the defined types were placed in this encounter.   Follow-up: - Return in 4 months for Annual physical vs Diabetes f/u - TBD  Patient verbalizes understanding with the above medical recommendations including the limitation of remote medical advice.  Specific follow-up and call-back criteria were given for patient to follow-up or seek medical care more urgently if needed.   - Time spent in direct consultation with patient on phone: 15 minutes  Nobie Putnam, [facility] 10/16/2018, 8:13 AM

## 2018-11-06 ENCOUNTER — Other Ambulatory Visit: Payer: Self-pay | Admitting: Family Medicine

## 2018-11-06 DIAGNOSIS — M5440 Lumbago with sciatica, unspecified side: Secondary | ICD-10-CM

## 2018-11-06 DIAGNOSIS — M5136 Other intervertebral disc degeneration, lumbar region: Secondary | ICD-10-CM

## 2018-11-06 DIAGNOSIS — G8929 Other chronic pain: Secondary | ICD-10-CM

## 2018-12-18 IMAGING — DX DG CHEST 2V
2 series · 2 of 2 positions shown · non-contrast
Comparison: None.

CLINICAL DATA: SHOB x 1 week, it is much worse when lying down, dry
slight cough, recent bronchitis for several weeks, mild edema, no
pain or tightness, nonsmoker, denies asthma, no other lung/heart
history or surgery, h/o cholecystectomy

EXAM:
CHEST  2 VIEW

[chest lat]
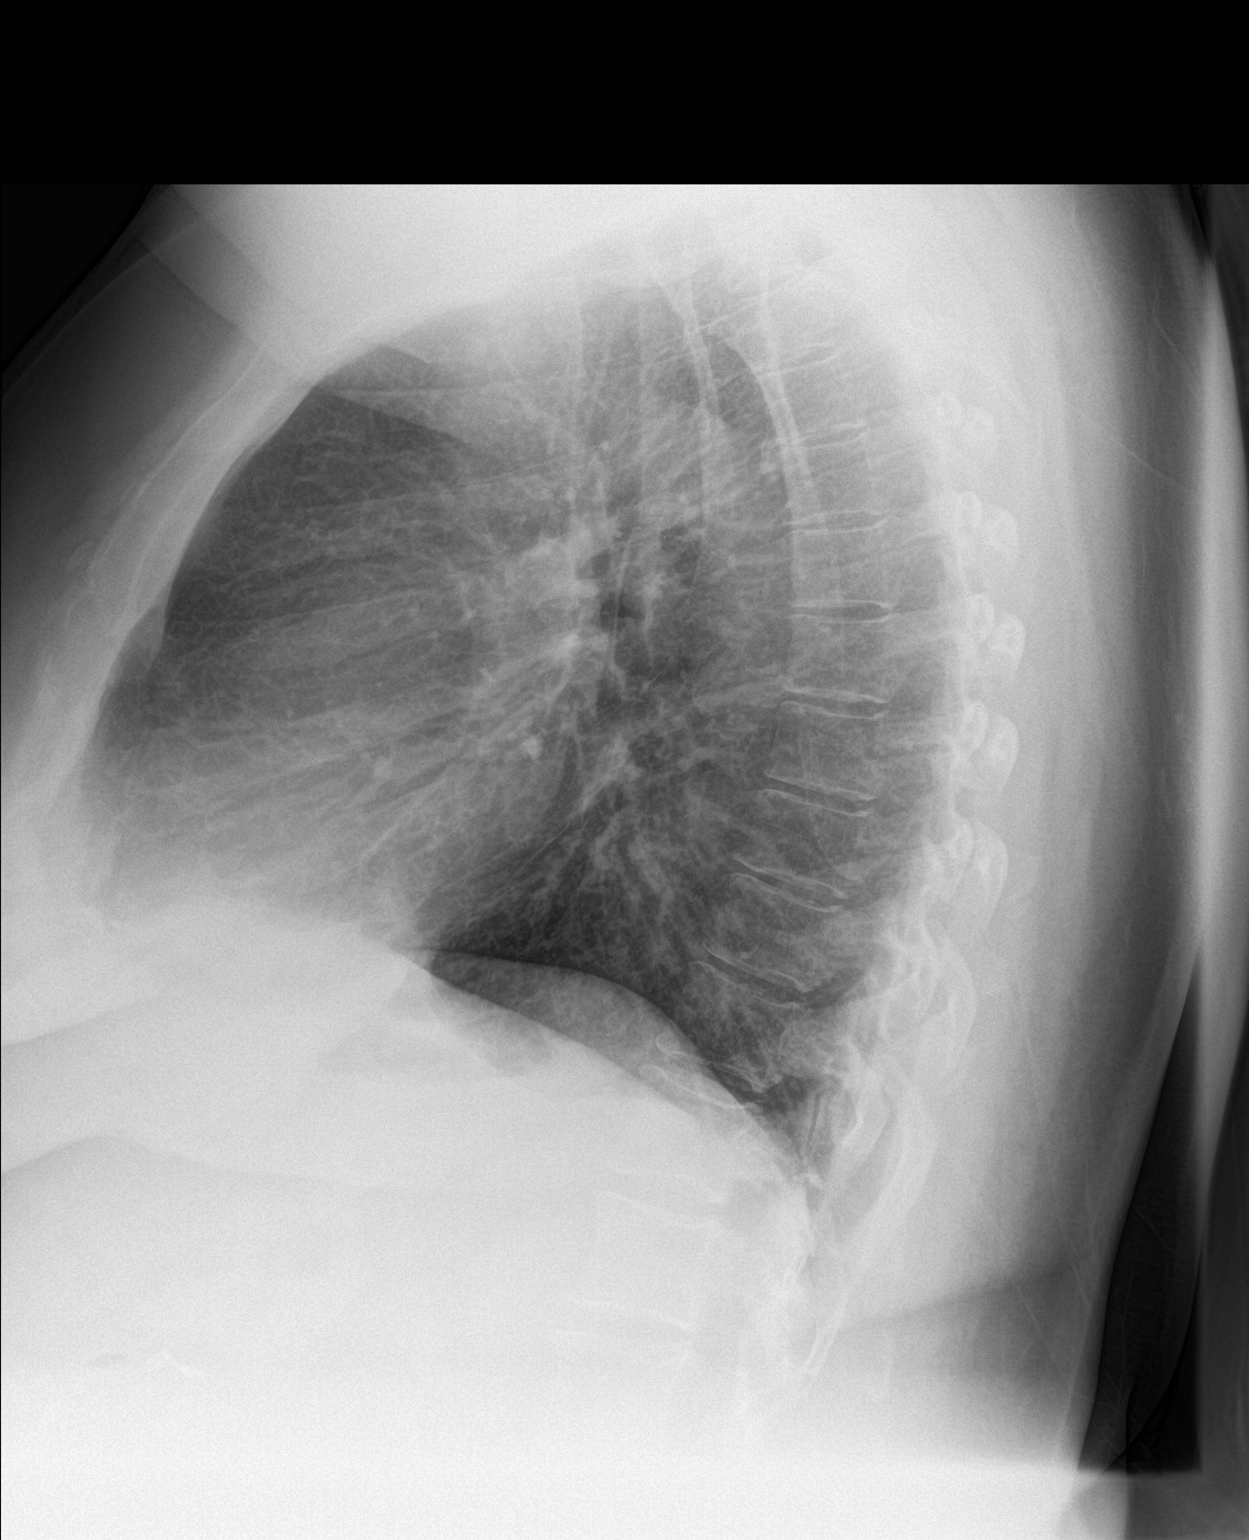

[chest pa]
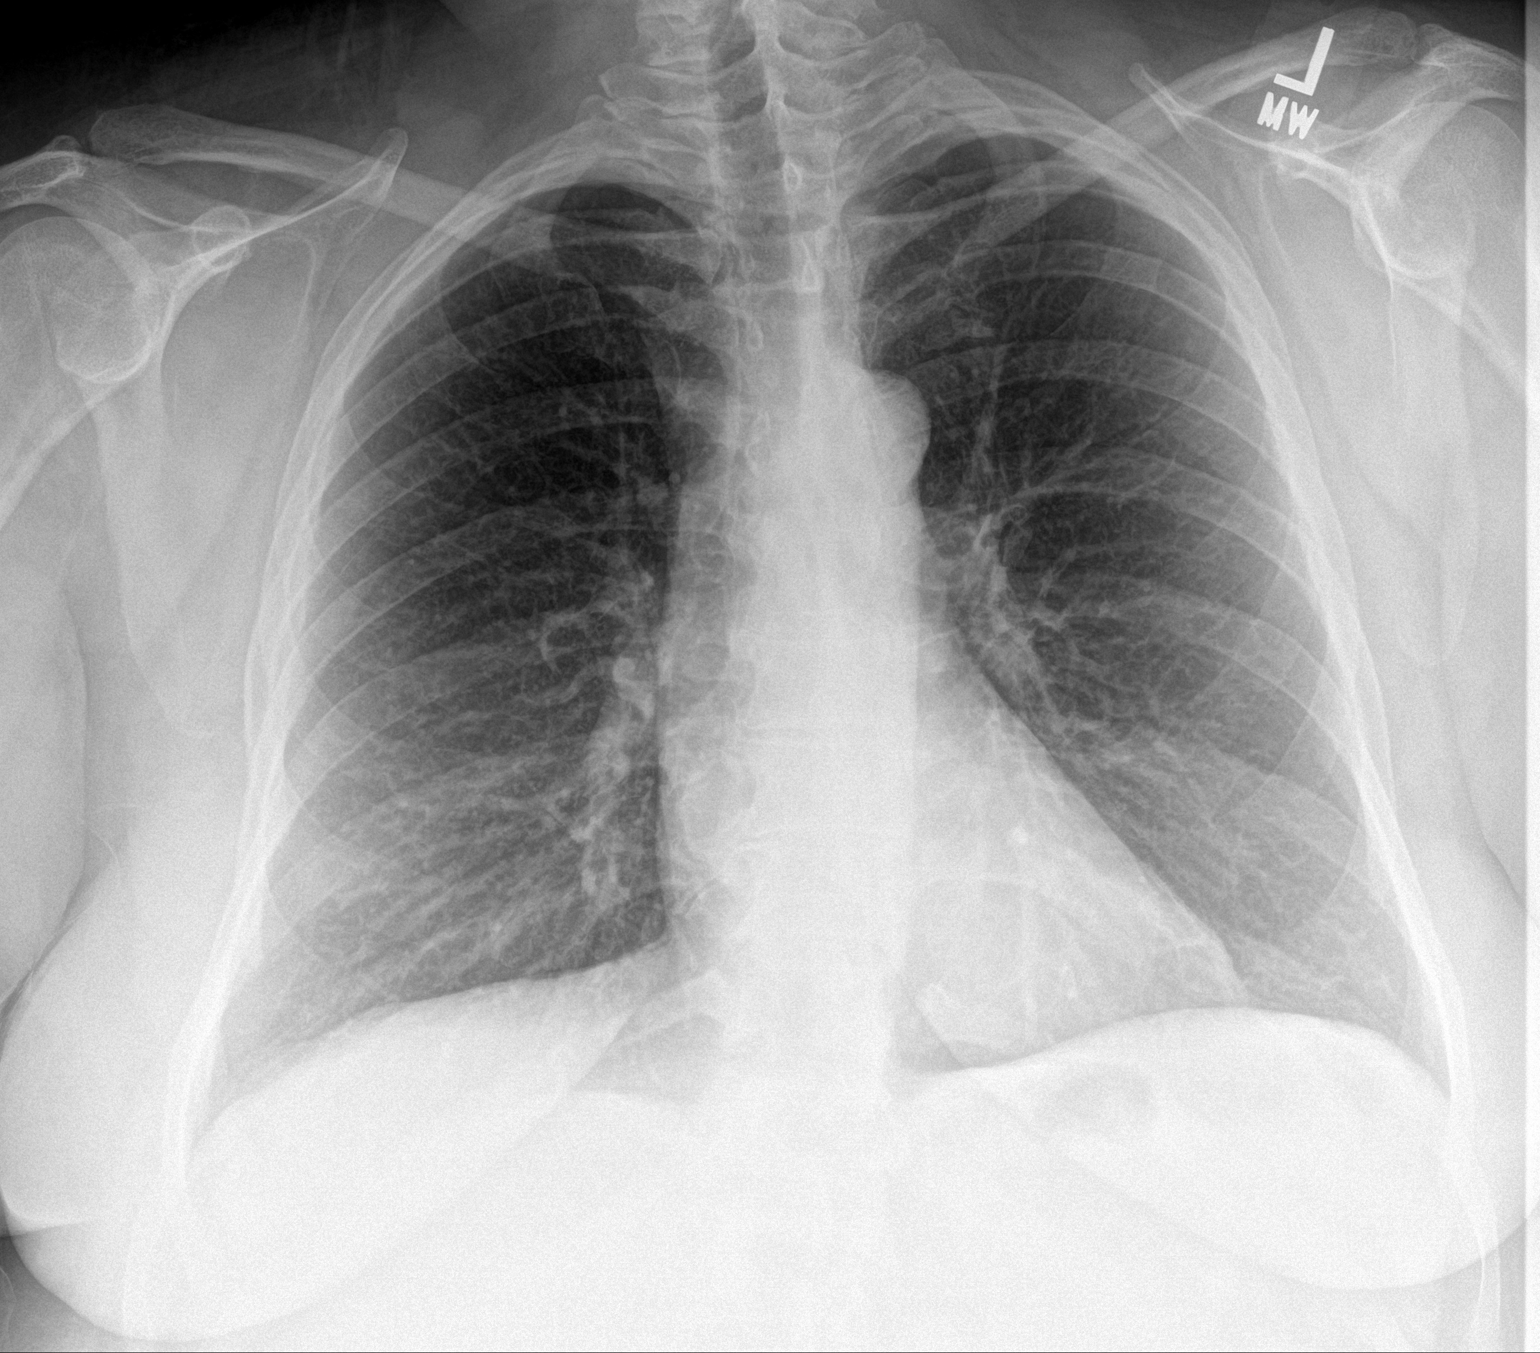

[2 of 2 positions shown; findings below may reference images not displayed]

FINDINGS: Normal heart, mediastinum and hila.

The lungs are clear.  No pleural effusion.  No pneumothorax.

Skeletal structures are unremarkable.
IMPRESSION: No active cardiopulmonary disease.

## 2019-01-29 ENCOUNTER — Other Ambulatory Visit: Payer: Self-pay | Admitting: Family Medicine

## 2019-01-29 DIAGNOSIS — M5136 Other intervertebral disc degeneration, lumbar region: Secondary | ICD-10-CM

## 2019-01-29 DIAGNOSIS — G8929 Other chronic pain: Secondary | ICD-10-CM

## 2019-02-07 ENCOUNTER — Telehealth: Payer: Self-pay | Admitting: Family Medicine

## 2019-02-07 NOTE — Telephone Encounter (Signed)
Pt.called today about appts her appts I explain to her that she have labs on the 11-4 and physical on 11-10. Pt said hat she wold only come in to get labs and cancel Physical appt. I explain to pt that she could not cancel physical and only do lab appt, then she tells me that she will be video the appt . And if she get sick whe will be coming back to [place] to pay all her Bills because she did not want to get sick. I told pt if she did not fill safe coming into the office we can cancel appts until she felt safe to come in to the office.

## 2019-02-13 ENCOUNTER — Other Ambulatory Visit: Payer: Self-pay

## 2019-02-13 ENCOUNTER — Telehealth: Payer: Self-pay

## 2019-02-13 ENCOUNTER — Other Ambulatory Visit: Payer: BLUE CROSS/BLUE SHIELD

## 2019-02-13 DIAGNOSIS — E1169 Type 2 diabetes mellitus with other specified complication: Secondary | ICD-10-CM

## 2019-02-13 DIAGNOSIS — Z0000 Encounter for general adult medical examination without abnormal findings: Secondary | ICD-10-CM

## 2019-02-13 DIAGNOSIS — E785 Hyperlipidemia, unspecified: Secondary | ICD-10-CM

## 2019-02-13 DIAGNOSIS — I10 Essential (primary) hypertension: Secondary | ICD-10-CM

## 2019-02-13 NOTE — Telephone Encounter (Signed)
Signed lab orders  Nobie Putnam, [facility] 02/13/2019, 8:06 AM

## 2019-02-14 LAB — CBC WITH DIFFERENTIAL/PLATELET
Absolute Monocytes: 616 cells/uL (ref 200–950)
Basophils Absolute: 71 cells/uL (ref 0–200)
Basophils Relative: 0.9 %
Eosinophils Absolute: 198 cells/uL (ref 15–500)
Eosinophils Relative: 2.5 %
HCT: 41.6 % (ref 35.0–45.0)
Hemoglobin: 13.6 g/dL (ref 11.7–15.5)
Lymphs Abs: 2947 cells/uL (ref 850–3900)
MCH: 30.8 pg (ref 27.0–33.0)
MCHC: 32.7 g/dL (ref 32.0–36.0)
MCV: 94.3 fL (ref 80.0–100.0)
MPV: 10.5 fL (ref 7.5–12.5)
Monocytes Relative: 7.8 %
Neutro Abs: 4069 cells/uL (ref 1500–7800)
Neutrophils Relative %: 51.5 %
Platelets: 309 10*3/uL (ref 140–400)
RBC: 4.41 10*6/uL (ref 3.80–5.10)
RDW: 11.8 % (ref 11.0–15.0)
Total Lymphocyte: 37.3 %
WBC: 7.9 10*3/uL (ref 3.8–10.8)

## 2019-02-14 LAB — LIPID PANEL
Cholesterol: 131 mg/dL (ref <200)
HDL: 30 mg/dL — ABNORMAL LOW (ref >=50)
LDL Cholesterol (Calc): 69 mg/dL (calc)
Non-HDL Cholesterol (Calc): 101 mg/dL (calc) (ref <130)
Total CHOL/HDL Ratio: 4.4 (calc) (ref <5.0)
Triglycerides: 232 mg/dL — ABNORMAL HIGH (ref <150)

## 2019-02-14 LAB — COMPREHENSIVE METABOLIC PANEL
AG Ratio: 2.2 (calc) (ref 1.0–2.5)
ALT: 21 U/L (ref 6–29)
AST: 10 U/L (ref 10–35)
Albumin: 4.6 g/dL (ref 3.6–5.1)
Alkaline phosphatase (APISO): 53 U/L (ref 37–153)
BUN/Creatinine Ratio: 33 (calc) — ABNORMAL HIGH (ref 6–22)
BUN: 27 mg/dL — ABNORMAL HIGH (ref 7–25)
CO2: 26 mmol/L (ref 20–32)
Calcium: 9.8 mg/dL (ref 8.6–10.4)
Chloride: 103 mmol/L (ref 98–110)
Creat: 0.81 mg/dL (ref 0.50–1.05)
Globulin: 2.1 g/dL (calc) (ref 1.9–3.7)
Glucose, Bld: 147 mg/dL — ABNORMAL HIGH (ref 65–99)
Potassium: 4.4 mmol/L (ref 3.5–5.3)
Sodium: 139 mmol/L (ref 135–146)
Total Bilirubin: 0.5 mg/dL (ref 0.2–1.2)
Total Protein: 6.7 g/dL (ref 6.1–8.1)

## 2019-02-14 LAB — HEMOGLOBIN A1C
Hgb A1c MFr Bld: 6 % of total Hgb — ABNORMAL HIGH (ref <5.7)
Mean Plasma Glucose: 126 (calc)
eAG (mmol/L): 7 (calc)

## 2019-02-14 LAB — TSH: TSH: 3.38 mIU/L

## 2019-02-20 ENCOUNTER — Other Ambulatory Visit: Payer: Self-pay | Admitting: Family Medicine

## 2019-02-20 ENCOUNTER — Other Ambulatory Visit: Payer: Self-pay

## 2019-02-20 ENCOUNTER — Ambulatory Visit (INDEPENDENT_AMBULATORY_CARE_PROVIDER_SITE_OTHER): Payer: BLUE CROSS/BLUE SHIELD | Admitting: Family Medicine

## 2019-02-20 ENCOUNTER — Encounter: Payer: Self-pay | Admitting: Family Medicine

## 2019-02-20 VITALS — BP 125/85 | HR 104 | Resp 16 | Ht 63.0 in | Wt 235.4 lb

## 2019-02-20 DIAGNOSIS — E1169 Type 2 diabetes mellitus with other specified complication: Secondary | ICD-10-CM | POA: Diagnosis not present

## 2019-02-20 DIAGNOSIS — E785 Hyperlipidemia, unspecified: Secondary | ICD-10-CM

## 2019-02-20 DIAGNOSIS — Z0000 Encounter for general adult medical examination without abnormal findings: Secondary | ICD-10-CM

## 2019-02-20 DIAGNOSIS — Z6841 Body Mass Index (BMI) 40.0 and over, adult: Secondary | ICD-10-CM

## 2019-02-20 DIAGNOSIS — I10 Essential (primary) hypertension: Secondary | ICD-10-CM | POA: Diagnosis not present

## 2019-02-20 DIAGNOSIS — Z1211 Encounter for screening for malignant neoplasm of colon: Secondary | ICD-10-CM

## 2019-02-20 NOTE — Assessment & Plan Note (Signed)
Controlled HTN Continue on current med regimen.

## 2019-02-20 NOTE — Assessment & Plan Note (Signed)
Significant improvement with A1c down to 6.0, previous 8.8, now on keto diet w/ weight loss Complications - hyperlipidemia, obesity - increases risk of future cardiovascular complications   Plan:  1. Continue current therapy - Metformin 1000mg  AM / 500mg  PM 2. Encourage improved lifestyle - low carb, low sugar diet, reduce portion size, continue improving regular exercise 3. Continue ACEi, Statin 5. Completed DM Foot exam today 6. Future recommend DM Eye exam 2021 - defer due to [place] pandemic  F/u 6 months  Future can consider Rybelsus option for GLP1 if indicated, assist with weight loss

## 2019-02-20 NOTE — Progress Notes (Signed)
Subjective:    Patient ID: Barbara Bird, female    DOB: 02-01-1966, 53 y.o.   MRN: [id number]  Barbara Bird is a 53 y.o. female presenting on 02/20/2019 for Annual Exam   HPI   Here for Annual Physical and Lab Review.  CHRONIC DM, Type 2 / Morbid Obesity BMI >41 / Hyperlipidemia Significant Improvement since last visit now on more strict keto diet. Last A1c 6.0 (02/2019) CBGs:Not checking CBG Meds:Metformin 1000mg  AM / 500mg  PM Reports good compliance. Tolerating well w/o side-effects Currently on ACEi On Atorvastatin 10mg  daily, with good cholesterol results, low LDL < 70. Mild elevated TG still >200 Lifestyle: - Weight down 10-12 lbs - Diet (Improving DM diet- now with low carb diet) - Due for DM Eye exam, declines to go to their office at the moment, based on coronavirus risk Denies hypoglycemia, blurry vision, polyuria  Back Pain, Chronic / Lumbar DDD / History of radiculitis See prior records with regard to chronic back pain Taking oral diclofenac for up to 2 weeks, with good results seems to have nearly no pain then it seems to be less effective. Then using topical diclofenac between. She may need a new rx of topical diclofenac as needed in future.   Health Maintenance: UTD Flu vaccine  Breast CA Screening: Due for mammogram screening.No prior mammogram result on file. She has declined screening in past. She is worried about cost.No prior history abnormal mammogram.No known family history of breast cancer. Currently asymptomatic. She will prefer to wait until after COVID19 pandemic before pursuing mammogram, anticipate in 2021  Colon CA Screening: Never had colonoscopyor other screening. She is concerned about cost and coverage of screening.Currently symptoms with IBS and diarrhea and fecal incontinence that are chronic and unrelated to request for screening.No known family history of colon CA. Due for screening testwill check into insurance cost for  cologuard -she would like to order this today.   Depression screen [facility] 2/9 02/20/2019 10/16/2018 04/14/2018  Decreased Interest 0 0 0  Down, Depressed, Hopeless 0 0 0  PHQ - 2 Score 0 0 0  Altered sleeping - - -  Tired, decreased energy - - -  Change in appetite - - -  Feeling bad or failure about yourself  - - -  Trouble concentrating - - -  Moving slowly or fidgety/restless - - -  Suicidal thoughts - - -  PHQ-9 Score - - -  Difficult doing work/chores - - -    Past Medical History:  Diagnosis Date  . Hypertension    Past Surgical History:  Procedure Laterality Date  . GALLBLADDER SURGERY    . TUBAL LIGATION     Social History   Socioeconomic History  . Marital status: Married    Spouse name: Not on file  . Number of children: Not on file  . Years of education: Not on file  . Highest education level: Not on file  Occupational History  . Not on file  Social Needs  . Financial resource strain: Not on file  . Food insecurity    Worry: Not on file    Inability: Not on file  . Transportation needs    Medical: Not on file    Non-medical: Not on file  Tobacco Use  . Smoking status: Never Smoker  . Smokeless tobacco: Never Used  Substance and Sexual Activity  . Alcohol use: Yes    Comment: occasional  . Drug use: No  . Sexual activity: Yes  Birth control/protection: Surgical  Lifestyle  . Physical activity    Days per week: Not on file    Minutes per session: Not on file  . Stress: Not on file  Relationships  . Social Musician on phone: Not on file    Gets together: Not on file    Attends religious service: Not on file    Active member of club or organization: Not on file    Attends meetings of clubs or organizations: Not on file    Relationship status: Not on file  . Intimate partner violence    Fear of current or ex partner: Not on file    Emotionally abused: Not on file    Physically abused: Not on file    Forced sexual activity: Not on  file  Other Topics Concern  . Not on file  Social History Narrative  . Not on file   Family History  Problem Relation Age of Onset  . Stroke Father 27  . Diabetes Father   . Cancer Maternal Aunt        [employer]  . Mental illness Maternal Grandmother    Current Outpatient Medications on File Prior to Visit  Medication Sig  . atorvastatin (LIPITOR) 10 MG tablet TAKE 1 TABLET BY MOUTH EVERY DAY  . carisoprodol (SOMA) 350 MG tablet 1 po bid prn  . diclofenac (VOLTAREN) 75 MG EC tablet TAKE 1 TABLET (75 MG TOTAL) BY MOUTH 2 (TWO) TIMES DAILY AS NEEDED. TAKE WITH FOOD.  Marland Kitchen dicyclomine (BENTYL) 10 MG capsule Take 1 capsule (10 mg total) by mouth 4 (four) times daily -  before meals and at bedtime.  . gabapentin (NEURONTIN) 300 MG capsule TAKE 2 CAPSULES (600 MG TOTAL) BY MOUTH 2 (TWO) TIMES DAILY.  Marland Kitchen ibuprofen (ADVIL,MOTRIN) 600 MG tablet   . lisinopril-hydrochlorothiazide (ZESTORETIC) 20-12.5 MG tablet TAKE 1 TABLET BY MOUTH EVERY DAY  . metFORMIN (GLUCOPHAGE) 1000 MG tablet TAKE 1 TABLET (1,000 MG TOTAL) BY MOUTH DAILY WITH BREAKFAST. TAKE WITH OTHER PILL  PER DAY (Patient taking differently: Take 1,000 mg by mouth daily with breakfast. )  . metFORMIN (GLUCOPHAGE) 500 MG tablet TAKE 1 TABLET (500 MG TOTAL) BY MOUTH DAILY WITH SUPPER. TAKE WITH OTHER PILL  PER DAY  . mometasone (NASONEX) 50 MCG/ACT nasal spray   . Nerve Stimulator (STANDARD TENS) DEVI by Does not apply route.  . valACYclovir (VALTREX) 1000 MG tablet Take 1 tablet (1,000 mg total) by mouth 2 (two) times daily. For 5-10 days as needed for HSV flare  . Menthol, Topical Analgesic, 10 % LIQD    No current facility-administered medications on file prior to visit.     Review of Systems  Constitutional: Negative for activity change, appetite change, chills, diaphoresis, fatigue and fever.  HENT: Negative for congestion and hearing loss.   Eyes: Negative for visual disturbance.  Respiratory: Negative for cough,  chest tightness, shortness of breath and wheezing.   Cardiovascular: Negative for chest pain, palpitations and leg swelling.  Gastrointestinal: Negative for abdominal pain, anal bleeding, blood in stool, constipation, diarrhea, nausea and vomiting.  Endocrine: Negative for cold intolerance.  Genitourinary: Negative for dysuria, frequency and hematuria.  Musculoskeletal: Positive for back pain. Negative for arthralgias and neck pain.  Skin: Negative for rash.  Allergic/Immunologic: Negative for environmental allergies.  Neurological: Negative for dizziness, weakness, light-headedness, numbness and headaches.  Hematological: Negative for adenopathy.  Psychiatric/Behavioral: Negative for behavioral problems, dysphoric mood and sleep disturbance. The patient is  nervous/anxious.    Per HPI unless specifically indicated above     Objective:    BP 125/85 (BP Location: Left Arm, Cuff Size: Large)   Pulse (!) 104   Resp 16   Ht  (1.6 m)   Wt 235 lb 6.4 oz (106.8 kg)   BMI 41.70 kg/m   Wt Readings from Last 3 Encounters:  02/20/19 235 lb 6.4 oz (106.8 kg)  04/14/18 247 lb (112 kg)  10/12/17 248 lb (112.5 kg)    Physical Exam Vitals signs and nursing note reviewed.  Constitutional:      General: She is not in acute distress.    Appearance: She is well-developed. She is not diaphoretic.     Comments: Well-appearing, comfortable, cooperative  HENT:     Head: Normocephalic and atraumatic.  Eyes:     General:        Right eye: No discharge.        Left eye: No discharge.     Conjunctiva/sclera: Conjunctivae normal.     Pupils: Pupils are equal, round, and reactive to light.  Neck:     Musculoskeletal: Normal range of motion.     Thyroid: No thyromegaly.  Cardiovascular:     Rate and Rhythm: Normal rate.     Pulses: Normal pulses.  Pulmonary:     Effort: Pulmonary effort is normal. No respiratory distress.     Breath sounds: No wheezing.  Abdominal:     General: There is no  distension.  Musculoskeletal: Normal range of motion.        General: No tenderness.     Comments: Upper / Lower Extremities: - Normal muscle tone, strength bilateral upper extremities 5/5, lower extremities 5/5  Skin:    General: Skin is warm and dry.     Findings: No erythema or rash.  Neurological:     Mental Status: She is alert and oriented to person, place, and time.     Comments: Distal sensation intact to light touch all extremities  Psychiatric:        Behavior: Behavior normal.     Comments: Well groomed, good eye contact, normal speech and thoughts       Diabetic Foot Exam - Simple   Simple Foot Form Diabetic Foot exam was performed with the following findings: Yes 02/20/2019 11:38 AM  Visual Inspection No deformities, no ulcerations, no other skin breakdown bilaterally: Yes Sensation Testing Intact to touch and monofilament testing bilaterally: Yes Pulse Check Posterior Tibialis and Dorsalis pulse intact bilaterally: Yes Comments     Results for orders placed or performed in visit on 02/13/19  Hemoglobin A1c  Result Value Ref Range   Hgb A1c MFr Bld 6.0 (H) <5.7 % of total Hgb   Mean Plasma Glucose 126 (calc)   eAG (mmol/L) 7.0 (calc)  Comprehensive metabolic panel  Result Value Ref Range   Glucose, Bld 147 (H) 65 - 99 mg/dL   BUN 27 (H) 7 - 25 mg/dL   Creat 1.61 0.96 - 0.45 mg/dL   BUN/Creatinine Ratio 33 (H) 6 - 22 (calc)   Sodium 139 135 - 146 mmol/L   Potassium 4.4 3.5 - 5.3 mmol/L   Chloride 103 98 - 110 mmol/L   CO2 26 20 - 32 mmol/L   Calcium 9.8 8.6 - 10.4 mg/dL   Total Protein 6.7 6.1 - 8.1 g/dL   Albumin 4.6 3.6 - 5.1 g/dL   Globulin 2.1 1.9 - 3.7 g/dL (calc)   AG Ratio 2.2  1.0 - 2.5 (calc)   Total Bilirubin 0.5 0.2 - 1.2 mg/dL   Alkaline phosphatase (APISO) 53 37 - 153 U/L   AST 10 10 - 35 U/L   ALT 21 6 - 29 U/L  TSH  Result Value Ref Range   TSH 3.38 mIU/L  CBC with Differential/Platelet  Result Value Ref Range   WBC 7.9 3.8 - 10.8  Thousand/uL   RBC 4.41 3.80 - 5.10 Million/uL   Hemoglobin 13.6 11.7 - 15.5 g/dL   HCT 41.6 35.0 - 45.0 %   MCV 94.3 80.0 - 100.0 fL   MCH 30.8 27.0 - 33.0 pg   MCHC 32.7 32.0 - 36.0 g/dL   RDW 11.8 11.0 - 15.0 %   Platelets 309 140 - 400 Thousand/uL   MPV 10.5 7.5 - 12.5 fL   Neutro Abs 4,069 1,500 - 7,800 cells/uL   Lymphs Abs 2,947 850 - 3,900 cells/uL   Absolute Monocytes 616 200 - 950 cells/uL   Eosinophils Absolute 198 15 - 500 cells/uL   Basophils Absolute 71 0 - 200 cells/uL   Neutrophils Relative % 51.5 %   Total Lymphocyte 37.3 %   Monocytes Relative 7.8 %   Eosinophils Relative 2.5 %   Basophils Relative 0.9 %  Lipid panel  Result Value Ref Range   Cholesterol 131 <200 mg/dL   HDL 30 (L) > OR = 50 mg/dL   Triglycerides 232 (H) <150 mg/dL   LDL Cholesterol (Calc) 69 mg/dL (calc)   Total CHOL/HDL Ratio 4.4 <5.0 (calc)   Non-HDL Cholesterol (Calc) 101 <130 mg/dL (calc)      Assessment & Plan:   Problem List Items Addressed This Visit    Type 2 diabetes mellitus with other specified complication (HCC)    Significant improvement with A1c down to 6.0, previous 8.8, now on keto diet w/ weight loss Complications - hyperlipidemia, obesity - increases risk of future cardiovascular complications   Plan:  1. Continue current therapy - Metformin 1000mg  AM / 500mg  PM 2. Encourage improved lifestyle - low carb, low sugar diet, reduce portion size, continue improving regular exercise 3. Continue ACEi, Statin 5. Completed DM Foot exam today 6. Future recommend DM Eye exam 2021 - defer due to [place] pandemic  F/u 6 months  Future can consider Rybelsus option for GLP1 if indicated, assist with weight loss      Morbid obesity with BMI of 40.0-44.9, adult (HCC)    Improved weight loss with diet lifetstyle improvement      Hyperlipidemia associated with type 2 diabetes mellitus (HCC)    Significantly improved LDL < 70, low total, and still low HDL elevated TG but improved  On Statin Allergic to Fish Oil Continue atorvastatin 10mg  daily Encourage continue keto diet Follow-up lab in 6 months      HTN (hypertension)    Controlled HTN Continue on current med regimen.       Other Visit Diagnoses    Annual physical exam    -  Primary   Screening for colon cancer       Relevant Orders   [facility] - Cologuard      Updated Health Maintenance information Reviewed recent lab results with patient Encouraged improvement to lifestyle with diet and exercise - Goal of weight loss, keep up on Keto Diet for now with improved A1c, weight loss   No orders of the defined types were placed in this encounter.   Follow up plan: Return in about 6 months (around 08/20/2019)  for 6 month follow-up lab results, DM HLD, back pain (Telephone Virtual).   Future labs ordered for 08/2019 - BMET, Lipid A1c  Saralyn PilarAlexander Molley Houser, DO [facility] 02/20/2019, 11:16 AM

## 2019-02-20 NOTE — Assessment & Plan Note (Addendum)
Significantly improved LDL < 70, low total, and still low HDL elevated TG but improved On Statin Allergic to Fish Oil Continue atorvastatin 10mg  daily Encourage continue keto diet Follow-up lab in 6 months

## 2019-02-20 NOTE — Assessment & Plan Note (Signed)
Improved weight loss with diet lifetstyle improvement

## 2019-02-20 NOTE — Patient Instructions (Addendum)
Thank you for coming to the office today.  Colon Cancer Screening: - For all adults age 52+ routine colon cancer screening is highly recommended.     - Recent guidelines from [place] recommend starting age of 72 - Early detection of colon cancer is important, because often there are no warning signs or symptoms, also if found early usually it can be cured. Late stage is hard to treat.  - If you are not interested in Colonoscopy screening (if done and normal you could be cleared for 5 to 10 years until next due), then Cologuard is an excellent alternative for screening test for Colon Cancer. It is highly sensitive for detecting DNA of colon cancer from even the earliest stages. Also, there is NO bowel prep required. - If Cologuard is NEGATIVE, then it is good for 3 years before next due - If Cologuard is POSITIVE, then it is strongly advised to get a Colonoscopy, which allows the GI doctor to locate the source of the cancer or polyp (even very early stage) and treat it by removing it. ------------------------- Ordered Cologuard  Follow instructions to collect sample, you may call the company for any help or questions, 24/7 telephone support at [phone].  Future To do list for health maintenance - Mammogram, Diabetic Eye Exam  Let me know when ready to order the Diclofenac cream - we can order that and encourage you to use it more often and less of the oral diclofenac pills if possible to limit any strain on kidney function.   Please schedule a Follow-up Appointment to: Return in about 6 months (around 08/20/2019) for 6 month follow-up lab results, DM HLD, back pain (Telephone Virtual).  If you have any other questions or concerns, please feel free to call the office or send a message through [place]. You may also schedule an earlier appointment if necessary.  Additionally, you may be receiving a survey about your experience at our office within a few days to 1 week by  e-mail or mail. We value your feedback.  Nobie Putnam, DO [place]

## 2019-03-05 LAB — COLOGUARD: Cologuard: POSITIVE — AB

## 2019-03-14 ENCOUNTER — Telehealth: Payer: Self-pay | Admitting: Family Medicine

## 2019-03-14 DIAGNOSIS — R195 Other fecal abnormalities: Secondary | ICD-10-CM

## 2019-03-14 NOTE — Telephone Encounter (Signed)
Received cologuard test result - it is POSITIVE (1st colorectal ca screen test)  Called patient to discuss  Reviewed the following conversation with her:  This is an abnormal result. And it means that we need more testing to determine if there is a problem with your colon.  I do not want you to be alarmed. Most of the time we get a positive Cologuard result, usually it is due to a Polyp that is either benign or possibly pre-cancer that is caught very early and is treatable before it ever turns into a problem or cancer.  The next step is a Colonoscopy procedure to look for the abnormality or polyp and remove and treat it. We would offer this by referring you to a GI specialist locally.  She agrees to referral will send to [facility]  Discussed concerns with COVID risk now, and will defer that decision to GI if they are scheduling and if patient wants to schedule now or in the near future within 1-3 months approximately.  Nobie Putnam, [facility] 03/14/2019, 6:03 PM

## 2019-03-15 ENCOUNTER — Telehealth: Payer: Self-pay | Admitting: Family Medicine

## 2019-03-15 DIAGNOSIS — R195 Other fecal abnormalities: Secondary | ICD-10-CM

## 2019-03-15 NOTE — Telephone Encounter (Signed)
See telephone note from yesterday 03/14/19  Referred to [facility] for positive Cologuard test - and she will need diagnostic colonoscopy scheduled.  However received this referral message today.      This patient has [facility] and is associated w/ [facility]. She will need a referral to a [facility] physician as we are out of network. Thank you for your referral.    I can place new order for External Referral to GI for [facility] physician for colonoscopy.  Would you be able to notify the patient that due to her insurance [facility] is out of network and it needs to be done by a [facility] Provider?  OR - perhaps would you reach [facility] if she would contact patient directly with this information.  I am not sure where the nearest location is - but I will go ahead and order a new referral and they can let her know her options.  Nobie Putnam, DO [facility] 03/15/2019, 1:36 PM

## 2019-03-15 NOTE — Telephone Encounter (Signed)
That would be great - I think local with [facility] would be no problem.  Thanks.  Nobie Putnam, DO [facility] 03/15/2019, 2:10 PM

## 2019-03-15 NOTE — Telephone Encounter (Signed)
As per Lexine Baton she can also go to [facility] you don't need to change the referral she will change it and work on scheduling and calling the patient.

## 2019-04-03 ENCOUNTER — Other Ambulatory Visit: Payer: Self-pay | Admitting: Family Medicine

## 2019-04-03 DIAGNOSIS — G8929 Other chronic pain: Secondary | ICD-10-CM

## 2019-04-03 DIAGNOSIS — E785 Hyperlipidemia, unspecified: Secondary | ICD-10-CM

## 2019-04-03 DIAGNOSIS — E1169 Type 2 diabetes mellitus with other specified complication: Secondary | ICD-10-CM

## 2019-04-03 DIAGNOSIS — I10 Essential (primary) hypertension: Secondary | ICD-10-CM

## 2019-04-11 ENCOUNTER — Encounter: Payer: Self-pay | Admitting: Family Medicine

## 2019-04-11 LAB — HM COLONOSCOPY

## 2019-04-28 ENCOUNTER — Other Ambulatory Visit: Payer: Self-pay | Admitting: Family Medicine

## 2019-04-28 DIAGNOSIS — G8929 Other chronic pain: Secondary | ICD-10-CM

## 2019-04-28 DIAGNOSIS — M5136 Other intervertebral disc degeneration, lumbar region: Secondary | ICD-10-CM

## 2019-06-12 ENCOUNTER — Encounter: Payer: Self-pay | Admitting: Family Medicine

## 2019-06-12 ENCOUNTER — Telehealth: Payer: Self-pay | Admitting: Family Medicine

## 2019-06-12 NOTE — Telephone Encounter (Signed)
Note written in Epic. She can access on MyChart  Advised if symptoms not improving or worsening, can seek follow up  Saralyn Pilar, DO [facility] 06/12/2019, 9:04 AM

## 2019-06-12 NOTE — Telephone Encounter (Signed)
Pt. Called said that she was not feeling well last night (think it was something she eat ). 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

## 2019-08-13 ENCOUNTER — Other Ambulatory Visit: Payer: BLUE CROSS/BLUE SHIELD

## 2019-08-14 ENCOUNTER — Other Ambulatory Visit: Payer: Self-pay

## 2019-08-14 DIAGNOSIS — E1169 Type 2 diabetes mellitus with other specified complication: Secondary | ICD-10-CM

## 2019-08-14 DIAGNOSIS — I10 Essential (primary) hypertension: Secondary | ICD-10-CM

## 2019-08-15 LAB — LIPID PANEL
Cholesterol: 171 mg/dL (ref <200)
HDL: 35 mg/dL — ABNORMAL LOW (ref >=50)
Non-HDL Cholesterol (Calc): 136 mg/dL (calc) — ABNORMAL HIGH (ref <130)
Total CHOL/HDL Ratio: 4.9 (calc) (ref <5.0)
Triglycerides: 422 mg/dL — ABNORMAL HIGH (ref <150)

## 2019-08-15 LAB — BASIC METABOLIC PANEL WITH GFR
BUN: 23 mg/dL (ref 7–25)
CO2: 25 mmol/L (ref 20–32)
Calcium: 10 mg/dL (ref 8.6–10.4)
Chloride: 105 mmol/L (ref 98–110)
Creat: 0.77 mg/dL (ref 0.50–1.05)
GFR, Est African American: 103 mL/min/{1.73_m2} (ref >=60)
GFR, Est Non African American: 89 mL/min/{1.73_m2} (ref >=60)
Glucose, Bld: 129 mg/dL — ABNORMAL HIGH (ref 65–99)
Potassium: 4.1 mmol/L (ref 3.5–5.3)
Sodium: 140 mmol/L (ref 135–146)

## 2019-08-15 LAB — HEMOGLOBIN A1C
Hgb A1c MFr Bld: 6.2 % of total Hgb — ABNORMAL HIGH (ref <5.7)
Mean Plasma Glucose: 131 (calc)
eAG (mmol/L): 7.3 (calc)

## 2019-08-20 ENCOUNTER — Encounter: Payer: Self-pay | Admitting: Family Medicine

## 2019-08-20 ENCOUNTER — Telehealth (INDEPENDENT_AMBULATORY_CARE_PROVIDER_SITE_OTHER): Payer: Self-pay | Admitting: Family Medicine

## 2019-08-20 ENCOUNTER — Other Ambulatory Visit: Payer: Self-pay | Admitting: Family Medicine

## 2019-08-20 ENCOUNTER — Other Ambulatory Visit: Payer: Self-pay

## 2019-08-20 DIAGNOSIS — E1169 Type 2 diabetes mellitus with other specified complication: Secondary | ICD-10-CM

## 2019-08-20 DIAGNOSIS — I10 Essential (primary) hypertension: Secondary | ICD-10-CM

## 2019-08-20 DIAGNOSIS — Z6841 Body Mass Index (BMI) 40.0 and over, adult: Secondary | ICD-10-CM

## 2019-08-20 DIAGNOSIS — E785 Hyperlipidemia, unspecified: Secondary | ICD-10-CM

## 2019-08-20 DIAGNOSIS — Z0000 Encounter for general adult medical examination without abnormal findings: Secondary | ICD-10-CM

## 2019-08-20 NOTE — Patient Instructions (Addendum)
Thank you for coming to the office today.  Plant based Omega 3 give this a try.  Keep reducing cholesterol in diet.  Keep on same dose metformin 1000 AM and 500 PM.  Recent Labs    02/13/19 0805 08/14/19 0904  HGBA1C 6.0* 6.2*    DUE for FASTING BLOOD WORK (no food or drink after midnight before the lab appointment, only water or coffee without cream/sugar on the morning of)  SCHEDULE "Lab Only" visit in the morning at the clinic for lab draw in 6 MONTHS   - Make sure Lab Only appointment is at about 1 week before your next appointment, so that results will be available  For Lab Results, once available within 2-3 days of blood draw, you can can log in to MyChart online to view your results and a brief explanation. Also, we can discuss results at next follow-up visit.    Please schedule a Follow-up Appointment to: Return in about 6 months (around 02/20/2020) for Annual Physical.  If you have any other questions or concerns, please feel free to call the office or send a message through MyChart. You may also schedule an earlier appointment if necessary.  Additionally, you may be receiving a survey about your experience at our office within a few days to 1 week by e-mail or mail. We value your feedback.  Saralyn Pilar, DO [facility] [place]

## 2019-08-20 NOTE — Assessment & Plan Note (Signed)
Well-controlled HTN  No known complications    Plan:  1.  Continue current BP regimen - Lisinopril-HCTZ 2. Encourage improved lifestyle - low sodium diet, regular exercise 3. Continue monitor BP outside office, bring readings to next visit, if persistently >140/90 or new symptoms notify office sooner

## 2019-08-20 NOTE — Assessment & Plan Note (Signed)
Previously improved LDL, now unable to calc due to HyperTG >400 On Statin Allergic to Fish Oil - discussed option of PLANT Based Omega 3 - OTC can start this option now Continue atorvastatin 10mg  daily Follow-up lab in 6 months

## 2019-08-20 NOTE — Progress Notes (Signed)
Virtual Visit via Telephone The purpose of this virtual visit is to provide medical care while limiting exposure to the novel coronavirus (COVID19) for both patient and office staff.  Consent was obtained for phone visit:  Yes.   Answered questions that patient had about telehealth interaction:  Yes.   I discussed the limitations, risks, security and privacy concerns of performing an evaluation and management service by telephone. I also discussed with the patient that there may be a patient responsible charge related to this service. The patient expressed understanding and agreed to proceed.  Patient Location: Home Provider Location: [facility])  ---------------------------------------------------------------------- Chief Complaint  Patient presents with  . Diabetes  . Hyperlipidemia    S: Reviewed CMA documentation. I have called patient and gathered additional HPI as follows:   CHRONIC DM, Type 2 / Morbid Obesity BMI >41 / Hyperlipidemia Significant Improvement since last visit now on more strict keto diet. Last A1c 6.0 (02/2019) Now lab done recently A1c 6.2. CBGs:Not checking CBG Meds:Metformin 1000mg  AM / 500mg  PM Reports good compliance. Tolerating well w/o side-effects Currently on ACEi On Atorvastatin 10mg  daily, with good cholesterol results, low LDL < 70. Mild elevated TG still >200 Lifestyle: Weight improved - Diet (Improving DM diet- now with low carb diet) - Allergic to fish oil but hasn't tried plant based omega 3 - Due for DM Eye exam Denies hypoglycemia, blurry vision, polyuria  Back Pain, Chronic / Lumbar DDD / History of radiculitis See prior records with regard to chronic back pain   Denies any fevers, chills, sweats, body ache, cough, shortness of breath, sinus pain or pressure, headache, abdominal pain, diarrhea  Past Medical History:  Diagnosis Date  . Hypertension    Social History   Tobacco Use  . Smoking status:  Never Smoker  . Smokeless tobacco: Never Used  Substance Use Topics  . Alcohol use: Yes    Comment: occasional  . Drug use: No    Current Outpatient Medications:  .  atorvastatin (LIPITOR) 10 MG tablet, TAKE 1 TABLET BY MOUTH EVERY DAY, Disp: 90 tablet, Rfl: 1 .  carisoprodol (SOMA) 350 MG tablet, 1 po bid prn, Disp: , Rfl:  .  diclofenac (VOLTAREN) 75 MG EC tablet, TAKE 1 TABLET (75 MG TOTAL) BY MOUTH 2 (TWO) TIMES DAILY AS NEEDED. TAKE WITH FOOD., Disp: 60 tablet, Rfl: 2 .  dicyclomine (BENTYL) 10 MG capsule, Take 1 capsule (10 mg total) by mouth 4 (four) times daily -  before meals and at bedtime., Disp: 40 capsule, Rfl: 0 .  gabapentin (NEURONTIN) 300 MG capsule, TAKE 2 CAPSULES (600 MG TOTAL) BY MOUTH 2 (TWO) TIMES DAILY., Disp: 360 capsule, Rfl: 1 .  ibuprofen (ADVIL,MOTRIN) 600 MG tablet, , Disp: , Rfl: 0 .  lisinopril-hydrochlorothiazide (ZESTORETIC) 20-12.5 MG tablet, TAKE 1 TABLET BY MOUTH EVERY DAY, Disp: 90 tablet, Rfl: 1 .  Menthol, Topical Analgesic, 10 % LIQD, , Disp: , Rfl:  .  metFORMIN (GLUCOPHAGE) 1000 MG tablet, TAKE 1 TABLET (1,000 MG TOTAL) BY MOUTH DAILY WITH BREAKFAST. TAKE WITH OTHER PILL 500MG  PER DAY, Disp: 90 tablet, Rfl: 1 .  metFORMIN (GLUCOPHAGE) 500 MG tablet, TAKE 1 TABLET (500 MG TOTAL) BY MOUTH DAILY WITH SUPPER. TAKE WITH OTHER PILL 1000MG  PER DAY, Disp: 90 tablet, Rfl: 1 .  mometasone (NASONEX) 50 MCG/ACT nasal spray, , Disp: , Rfl: 0 .  Nerve Stimulator (STANDARD TENS) DEVI, by Does not apply route., Disp: , Rfl:  .  valACYclovir (VALTREX) 1000  MG tablet, Take 1 tablet (1,000 mg total) by mouth 2 (two) times daily. For 5-10 days as needed for HSV flare, Disp: 10 tablet, Rfl: 2  Depression screen [facility] 2/9 08/20/2019 02/20/2019 10/16/2018  Decreased Interest 0 0 0  Down, Depressed, Hopeless 0 0 0  PHQ - 2 Score 0 0 0  Altered sleeping - - -  Tired, decreased energy - - -  Change in appetite - - -  Feeling bad or failure about yourself  - - -  Trouble  concentrating - - -  Moving slowly or fidgety/restless - - -  Suicidal thoughts - - -  PHQ-9 Score - - -  Difficult doing work/chores - - -    No flowsheet data found.  -------------------------------------------------------------------------- O: No physical exam performed due to remote telephone encounter.  Lab results reviewed.  Recent Results (from the past 2160 hour(s))  [facility] - BMET w/ GFR BMP Basic Metabolic Panel     Status: Abnormal   Collection Time: 08/14/19  9:04 AM  Result Value Ref Range   Glucose, Bld 129 (H) 65 - 99 mg/dL    Comment: .            Fasting reference interval . For someone without known diabetes, a glucose value >125 mg/dL indicates that they may have diabetes and this should be confirmed with a follow-up test. .    BUN 23 7 - 25 mg/dL   Creat 7.59 1.63 - 8.46 mg/dL    Comment: For patients >58 years of age, the reference limit for Creatinine is approximately 13% higher for people identified as African-American. .    GFR, Est Non African American 89 > OR = 60 mL/min/1.46m2   GFR, Est African American 103 > OR = 60 mL/min/1.84m2   BUN/Creatinine Ratio NOT APPLICABLE 6 - 22 (calc)   Sodium 140 135 - 146 mmol/L   Potassium 4.1 3.5 - 5.3 mmol/L   Chloride 105 98 - 110 mmol/L   CO2 25 20 - 32 mmol/L   Calcium 10.0 8.6 - 10.4 mg/dL  [facility] - Lipid panel physical     Status: Abnormal   Collection Time: 08/14/19  9:04 AM  Result Value Ref Range   Cholesterol 171 <200 mg/dL   HDL 35 (L) > OR = 50 mg/dL   Triglycerides 659 (H) <150 mg/dL    Comment: . If a non-fasting specimen was collected, consider repeat triglyceride testing on a fasting specimen if clinically indicated.  Perry Mount et al. J. of Clin. Lipidol. 2015;9:129-169. Marland Kitchen    LDL Cholesterol (Calc)  mg/dL (calc)    Comment: . LDL cholesterol not calculated. Triglyceride levels greater than 400 mg/dL invalidate calculated LDL results. . Reference range: <100 . Desirable range <100  mg/dL for primary prevention;   <70 mg/dL for patients with CHD or diabetic patients  with > or = 2 CHD risk factors. Marland Kitchen LDL-C is now calculated using the Martin-Hopkins  calculation, which is a validated novel method providing  better accuracy than the Friedewald equation in the  estimation of LDL-C.  Horald Pollen et al. Lenox Ahr. 9357;017(79): 2061-2068  (http://education.[facility].com/faq/FAQ164)    Total CHOL/HDL Ratio 4.9 <5.0 (calc)   Non-HDL Cholesterol (Calc) 136 (H) <130 mg/dL (calc)    Comment: For patients with diabetes plus 1 major ASCVD risk  factor, treating to a non-HDL-C goal of <100 mg/dL  (LDL-C of <39 mg/dL) is considered a therapeutic  option.   [facility] - A1c LAB Hemoglobin A1C physical  Status: Abnormal   Collection Time: 08/14/19  9:04 AM  Result Value Ref Range   Hgb A1c MFr Bld 6.2 (H) <5.7 % of total Hgb    Comment: For someone without known diabetes, a hemoglobin  A1c value between 5.7% and 6.4% is consistent with prediabetes and should be confirmed with a  follow-up test. . For someone with known diabetes, a value <7% indicates that their diabetes is well controlled. A1c targets should be individualized based on duration of diabetes, age, comorbid conditions, and other considerations. . This assay result is consistent with an increased risk of diabetes. . Currently, no consensus exists regarding use of hemoglobin A1c for diagnosis of diabetes for children. .    Mean Plasma Glucose 131 (calc)   eAG (mmol/L) 7.3 (calc)    -------------------------------------------------------------------------- A&P:  Problem List Items Addressed This Visit    Type 2 diabetes mellitus with other specified complication ([place]) - Primary    Significant improvement with A1c still stable 6 to 6.2, last result now at 6.2 Overall much improved w/ wt loss diet changes Complications - hyperlipidemia, obesity - increases risk of future cardiovascular complications    Plan:  1. Continue current therapy - Metformin 1000mg  AM / 500mg  PM 2. Encourage improved lifestyle - low carb, low sugar diet, reduce portion size, continue improving regular exercise 3. Continue ACEi, Statin Future recommend DM Eye exam 2021 - defer due to [place] pandemic  F/u 6 months  Future can consider Rybelsus option for GLP1 if indicated, assist with weight loss      Hyperlipidemia associated with type 2 diabetes mellitus ([place])    Previously improved LDL, now unable to calc due to HyperTG >400 On Statin Allergic to Fish Oil - discussed option of PLANT Based Omega 3 - OTC can start this option now Continue atorvastatin 10mg  daily Follow-up lab in 6 months      HTN (hypertension)    Well-controlled HTN  No known complications    Plan:  1.  Continue current BP regimen - Lisinopril-HCTZ 2. Encourage improved lifestyle - low sodium diet, regular exercise 3. Continue monitor BP outside office, bring readings to next visit, if persistently >140/90 or new symptoms notify office sooner         No orders of the defined types were placed in this encounter.   Follow-up: - Return in 6 months for Annual Physical - Future labs ordered for 02/19/20  Patient verbalizes understanding with the above medical recommendations including the limitation of remote medical advice.  Specific follow-up and call-back criteria were given for patient to follow-up or seek medical care more urgently if needed.   - Time spent in direct consultation with patient on phone: 9 minutes  Nobie Putnam, [facility] 08/20/2019, 11:17 AM

## 2019-08-20 NOTE — Assessment & Plan Note (Signed)
Significant improvement with A1c still stable 6 to 6.2, last result now at 6.2 Overall much improved w/ wt loss diet changes Complications - hyperlipidemia, obesity - increases risk of future cardiovascular complications   Plan:  1. Continue current therapy - Metformin 1000mg  AM / 500mg  PM 2. Encourage improved lifestyle - low carb, low sugar diet, reduce portion size, continue improving regular exercise 3. Continue ACEi, Statin Future recommend DM Eye exam 2021 - defer due to COVID19 pandemic  F/u 6 months  Future can consider Rybelsus option for GLP1 if indicated, assist with weight loss

## 2019-10-01 ENCOUNTER — Other Ambulatory Visit: Payer: Self-pay | Admitting: Family Medicine

## 2019-10-01 DIAGNOSIS — E1169 Type 2 diabetes mellitus with other specified complication: Secondary | ICD-10-CM

## 2019-10-01 DIAGNOSIS — M5440 Lumbago with sciatica, unspecified side: Secondary | ICD-10-CM

## 2019-10-01 DIAGNOSIS — G8929 Other chronic pain: Secondary | ICD-10-CM

## 2019-10-01 DIAGNOSIS — I10 Essential (primary) hypertension: Secondary | ICD-10-CM

## 2019-10-01 MED ORDER — GABAPENTIN 600 MG PO TABS: 600.0000 mg | ORAL_TABLET | Freq: Two times a day (BID) | ORAL | 1 refills | Status: DC

## 2019-10-01 NOTE — Telephone Encounter (Signed)
Requested Prescriptions  Pending Prescriptions Disp Refills   metFORMIN (GLUCOPHAGE) 1000 MG tablet [Pharmacy Med Name: METFORMIN HCL 1,000 MG TABLET] 90 tablet 1    Sig: TAKE 1 TABLET (1,000 MG TOTAL) BY MOUTH DAILY WITH BREAKFAST. TAKE WITH OTHER PILL 500MG PER DAY     Endocrinology:  Diabetes - Biguanides Passed - 10/01/2019  1:25 AM      Passed - Cr in normal range and within 360 days    Creat  Date Value Ref Range Status  08/14/2019 0.77 0.50 - 1.05 mg/dL Final    Comment:    For patients >53 years of age, the reference limit for Creatinine is approximately 13% higher for people identified as African-American. .          Passed - HBA1C is between 0 and 7.9 and within 180 days    Hgb A1c MFr Bld  Date Value Ref Range Status  08/14/2019 6.2 (H) <5.7 % of total Hgb Final    Comment:    For someone without known diabetes, a hemoglobin  A1c value between 5.7% and 6.4% is consistent with prediabetes and should be confirmed with a  follow-up test. . For someone with known diabetes, a value <7% indicates that their diabetes is well controlled. A1c targets should be individualized based on duration of diabetes, age, comorbid conditions, and other considerations. . This assay result is consistent with an increased risk of diabetes. . Currently, no consensus exists regarding use of hemoglobin A1c for diagnosis of diabetes for children. .          Passed - eGFR in normal range and within 360 days    GFR, Est African American  Date Value Ref Range Status  08/14/2019 103 > OR = 60 mL/min/1.81m Final   GFR, Est Non African American  Date Value Ref Range Status  08/14/2019 89 > OR = 60 mL/min/1.759mFinal         Passed - Valid encounter within last 6 months    Recent Outpatient Visits          1 month ago Type 2 diabetes mellitus with other specified complication, without long-term current use of insulin ([place]  [place]    7 months ago Annual physical exam   [facility]   11 months ago Type 2 diabetes mellitus with other specified complication, without long-term current use of insulin ([place]  [facility]   1 year ago Type 2 diabetes mellitus with other specified complication, without long-term current use of insulin ([place]  [facility]   1 year ago Encounter for general medical examination   So[place]      Future Appointments            In 4 months KaPar[place] RangerAlDevonne DoughtyDO [facility]            atorvastatin (LIPITOR) 10 MG tablet [Pharmacy Med Name: ATORVASTATIN 10 MG TABLET] 90 tablet 1    Sig: TAKE 1 TABLET BY MOUTH EVERY DAY     Cardiovascular:  Antilipid - Statins Failed - 10/01/2019  1:25 AM      Failed - HDL in normal range and within 360 days    HDL  Date Value Ref Range Status  08/14/2019 35 (L) > OR = 50 mg/dL Final  04/02/2015 32 (L) >39 mg/dL Final         Failed - Triglycerides in normal range and within 360 days    Triglycerides  Date Value Ref Range Status  08/14/2019 422 (H) <150 mg/dL Final    Comment:    . If a non-fasting specimen was collected, consider repeat triglyceride testing on a fasting specimen if clinically indicated.  Yates Decamp et al. J. of Clin. Lipidol. 1025;8:527-782. Marland Kitchen          Passed - Total Cholesterol in normal range and within 360 days    Cholesterol, Total  Date Value Ref Range Status  04/02/2015 97 (L) 100 - 199 mg/dL Final   Cholesterol  Date Value Ref Range Status  08/14/2019 171 <200 mg/dL Final         Passed - LDL in normal range and within 360 days    LDL Cholesterol (Calc)  Date Value Ref Range Status  08/14/2019  mg/dL (calc) Final    Comment:    . LDL cholesterol not calculated. Triglyceride levels greater than 400 mg/dL  invalidate calculated LDL results. . Reference range: <100 . Desirable range <100 mg/dL for primary prevention;   <70 mg/dL for patients with CHD or diabetic patients  with > or = 2 CHD risk factors. Marland Kitchen LDL-C is now calculated using the Martin-Hopkins  calculation, which is a validated novel method providing  better accuracy than the Friedewald equation in the  estimation of LDL-C.  Cresenciano Genre et al. Annamaria Helling. 4235;361(44): 2061-2068  (http://education.[facility].com/faq/FAQ164)          Passed - Patient is not pregnant      Passed - Valid encounter within last 12 months    Recent Outpatient Visits          1 month ago Type 2 diabetes mellitus with other specified complication, without long-term current use of insulin ([place])   [facility], Devonne Doughty, DO   7 months ago Annual physical exam   [facility] Olin Hauser, DO   11 months ago Type 2 diabetes mellitus with other specified complication, without long-term current use of insulin ([place])   [facility], Devonne Doughty, DO   1 year ago Type 2 diabetes mellitus with other specified complication, without long-term current use of insulin ([place])   [facility] Olin Hauser, DO   1 year ago Encounter for general medical examination   [place], DO      Future Appointments            In 4 months [place], Devonne Doughty, DO [facility]            metFORMIN (GLUCOPHAGE) 500 MG tablet [Pharmacy Med Name: METFORMIN HCL 500 MG TABLET] 90 tablet 1    Sig: TAKE 1 TABLET (500 MG TOTAL) BY MOUTH DAILY WITH SUPPER. TAKE WITH OTHER PILL 1000MG PER DAY     Endocrinology:  Diabetes - Biguanides Passed - 10/01/2019  1:25 AM      Passed - Cr in normal range and within 360 days    Creat  Date Value Ref Range Status  08/14/2019 0.77 0.50 - 1.05 mg/dL Final    Comment:     For patients >65 years of age, the reference limit for Creatinine is approximately 13% higher for people identified as African-American. .          Passed - HBA1C is  between 0 and 7.9 and within 180 days    Hgb A1c MFr Bld  Date Value Ref Range Status  08/14/2019 6.2 (H) <5.7 % of total Hgb Final    Comment:    For someone without known diabetes, a hemoglobin  A1c value between 5.7% and 6.4% is consistent with prediabetes and should be confirmed with a  follow-up test. . For someone with known diabetes, a value <7% indicates that their diabetes is well controlled. A1c targets should be individualized based on duration of diabetes, age, comorbid conditions, and other considerations. . This assay result is consistent with an increased risk of diabetes. . Currently, no consensus exists regarding use of hemoglobin A1c for diagnosis of diabetes for children. .          Passed - eGFR in normal range and within 360 days    GFR, Est African American  Date Value Ref Range Status  08/14/2019 103 > OR = 60 mL/min/1.40m Final   GFR, Est Non African American  Date Value Ref Range Status  08/14/2019 89 > OR = 60 mL/min/1.748mFinal         Passed - Valid encounter within last 6 months    Recent Outpatient Visits          1 month ago Type 2 diabetes mellitus with other specified complication, without long-term current use of insulin (HCFort [place]  So[place]DO   7 months ago Annual physical exam   [facility]   11 months ago Type 2 diabetes mellitus with other specified complication, without long-term current use of insulin (H[place]  [facility]oughtyDO   1 year ago Type 2 diabetes mellitus with other specified complication, without long-term current use of insulin (HCP[place]  SoCussetaAlDevonne DoughtyDO   1 year ago Encounter for  general medical examination   SoCl[place]DO      Future Appointments            In 4 months KaParks RangerAlDevonne DoughtyDO [facility]            lisinopril-hydrochlorothiazide (ZESTORETIC) 20-12.5 MG tablet [Pharmacy Med Name: LISINOPRIL-HCTZ 20-12.5 MG TAB] 90 tablet 1    Sig: TAKE 1 TABLET BY MOUTH EVERY DAY     Cardiovascular:  ACEI + Diuretic Combos Passed - 10/01/2019  1:25 AM      Passed - Na in normal range and within 180 days    Sodium  Date Value Ref Range Status  08/14/2019 140 135 - 146 mmol/L Final  07/22/2015 140 134 - 144 mmol/L Final         Passed - K in normal range and within 180 days    Potassium  Date Value Ref Range Status  08/14/2019 4.1 3.5 - 5.3 mmol/L Final         Passed - Cr in normal range and within 180 days    Creat  Date Value Ref Range Status  08/14/2019 0.77 0.50 - 1.05 mg/dL Final    Comment:    For patients >4970ears of age, the reference limit for Creatinine is approximately 13% higher for people identified as African-American. .          Passed - Ca in normal range and within 180 days    Calcium  Date Value Ref Range Status  08/14/2019 10.0 8.6 - 10.4 mg/dL Final         Passed - Patient is not pregnant      Passed - Last BP in normal range    BP Readings from Last 1 Encounters:  02/20/19 125/85         Passed - Valid encounter within last 6 months    Recent Outpatient Visits          1 month ago Type 2 diabetes mellitus with other specified complication, without long-term current use of insulin ([place])   [facility], Devonne Doughty, DO   7 months ago Annual physical exam   [facility] Olin Hauser, DO   11 months ago Type 2 diabetes mellitus with other specified complication, without long-term current use of insulin ([place])   [facility], Devonne Doughty, DO   1 year ago Type 2  diabetes mellitus with other specified complication, without long-term current use of insulin ([place])   [facility] Olin Hauser, DO   1 year ago Encounter for general medical examination   [place], DO      Future Appointments            In 4 months [place], Devonne Doughty, [facility]            gabapentin (NEURONTIN) 300 MG capsule [Pharmacy Med Name: GABAPENTIN 300 MG CAPSULE] 360 capsule 1    Sig: TAKE 2 CAPSULES (600 MG TOTAL) BY MOUTH 2 (TWO) TIMES DAILY.     Neurology: Anticonvulsants - gabapentin Passed - 10/01/2019  1:25 AM      Passed - Valid encounter within last 12 months    Recent Outpatient Visits          1 month ago Type 2 diabetes mellitus with other specified complication, without long-term current use of insulin [facility])   [place], DO   7 months ago Annual physical exam   [facility] Olin Hauser, DO   11 months ago Type 2 diabetes mellitus with other specified complication, without long-term current use of insulin [facility])   [facility], Devonne Doughty, DO   1 year ago Type 2 diabetes mellitus with other specified complication, without long-term current use of insulin ([place])   [facility]   1 year ago Encounter for general medical examination   [place], DO      Future Appointments            In 4 months [place], Devonne Doughty, DO [facility]

## 2019-10-01 NOTE — Telephone Encounter (Signed)
Requested medication (s) are due for refill today: yes  Requested medication (s) are on the active medication list: yes  Last refill: 07/04/19  Future visit scheduled: yes  Notes to clinic:  insurance requests alternative medication    Requested Prescriptions  Pending Prescriptions Disp Refills   gabapentin (NEURONTIN) 300 MG capsule [Pharmacy Med Name: GABAPENTIN 300 MG CAPSULE] 360 capsule 1    Sig: TAKE 2 CAPSULES (600 MG TOTAL) BY MOUTH 2 (TWO) TIMES DAILY.      Neurology: Anticonvulsants - gabapentin Passed - 10/01/2019  1:25 AM      Passed - Valid encounter within last 12 months    Recent Outpatient Visits           1 month ago Type 2 diabetes mellitus with other specified complication, without long-term current use of insulin ([place])   [place], DO   7 months ago Annual physical exam   [facility] Olin Hauser, DO   11 months ago Type 2 diabetes mellitus with other specified complication, without long-term current use of insulin ([place])   [facility], Devonne Doughty, DO   1 year ago Type 2 diabetes mellitus with other specified complication, without long-term current use of insulin ([place])   [facility] Olin Hauser, DO   1 year ago Encounter for general medical examination   [place], DO       Future Appointments             In 4 months Parks Ranger, Devonne Doughty, DO [facility]             Signed Prescriptions Disp Refills   metFORMIN (GLUCOPHAGE) 1000 MG tablet 90 tablet 1    Sig: TAKE 1 TABLET (1,000 MG TOTAL) BY MOUTH DAILY WITH BREAKFAST. TAKE WITH OTHER PILL 500MG PER DAY      Endocrinology:  Diabetes - Biguanides Passed - 10/01/2019  1:25 AM      Passed - Cr in normal range and within 360 days    Creat  Date Value Ref Range Status  08/14/2019 0.77 0.50 - 1.05 mg/dL Final     Comment:    For patients >57 years of age, the reference limit for Creatinine is approximately 13% higher for people identified as African-American. .           Passed - HBA1C is between 0 and 7.9 and within 180 days    Hgb A1c MFr Bld  Date Value Ref Range Status  08/14/2019 6.2 (H) <5.7 % of total Hgb Final    Comment:    For someone without known diabetes, a hemoglobin  A1c value between 5.7% and 6.4% is consistent with prediabetes and should be confirmed with a  follow-up test. . For someone with known diabetes, a value <7% indicates that their diabetes is well controlled. A1c targets should be individualized based on duration of diabetes, age, comorbid conditions, and other considerations. . This assay result is consistent with an increased risk of diabetes. . Currently, no consensus exists regarding use of hemoglobin A1c for diagnosis of diabetes for children. .           Passed - eGFR in normal range and within 360 days    GFR, Est African American  Date Value Ref Range Status  08/14/2019 103 > OR = 60 mL/min/1.73m Final   GFR, Est Non African American  Date  Value Ref Range Status  08/14/2019 89 > OR = 60 mL/min/1.52m Final          Passed - Valid encounter within last 6 months    Recent Outpatient Visits           1 month ago Type 2 diabetes mellitus with other specified complication, without long-term current use of insulin ([place]   [facility]   7 months ago [facility] physical [facility]   [facility]   11 months ago Type 2 diabetes mellitus with other specified complication, without long-term current use of insulin ([place]   [facility]   1 year ago Type 2 diabetes mellitus with other specified complication, without long-term current use of insulin ([place]   [facility]   1 year  ago Encounter for general medical examination   [place] DO       Future Appointments             In 4 months KParks Ranger [facility]evonne [facility]ought[facility]              atorvastatin (LIPITOR) 10 MG tablet 90 tablet 1    Sig: TAKE 1 TABLET BY MOUTH EVERY DAY      Cardiovascular:  Antilipid - Statins Failed - 10/01/2019  1:25 AM      Failed - HDL in normal range and within 360 days    HDL  Date Value Ref Range Status  08/14/2019 35 (L) > OR = 50 mg/dL Final  04/02/2015 32 (L) >39 mg/dL Final          Failed - Triglycerides in normal range and within 360 days    Triglycerides  Date Value Ref Range Status  08/14/2019 422 (H) <150 mg/dL Final    Comment:    . If a non-fasting specimen was collected, consider repeat triglyceride testing on a fasting specimen if clinically indicated.  JYates Decampet al. J. of Clin. Lipidol. 25726;2:035-597 .Marland Kitchen          Passed - Total Cholesterol in normal range and within 360 days    Cholesterol, Total  Date Value Ref Range Status  04/02/2015 97 (L) 100 - 199 mg/dL Final   Cholesterol  Date Value Ref Range Status  08/14/2019 171 <200 mg/dL Final          Passed - LDL in normal range and within 360 days    LDL Cholesterol (Calc)  Date Value Ref Range Status  08/14/2019  mg/dL (calc) Final    Comment:    . LDL cholesterol not calculated. Triglyceride levels greater than 400 mg/dL invalidate calculated LDL results. . Reference range: <100 . Desirable range <100 mg/dL for primary prevention;   <70 mg/dL for patients with CHD or diabetic patients  with > or = 2 CHD risk factors. .Marland KitchenLDL-C is now calculated using the Martin-Hopkins  calculation, which is a validated novel method providing  better accuracy than the Friedewald equation in the  estimation of LDL-C.  MCresenciano Genreet al. JAnnamaria Helling 24163;845(36: 2061-2068  (http://education.QuestDiagnostics.com/faq/FAQ164)            Passed - Patient is not pregnant      Passed - Valid encounter within last 12 months    Recent Outpatient Visits           1  month ago Type 2 diabetes mellitus with other specified complication, without long-term current use of insulin ([place])   [place], DO   7 months ago Annual physical exam   [facility] Olin Hauser, DO   11 months ago Type 2 diabetes mellitus with other specified complication, without long-term current use of insulin ([place])   [facility], Devonne Doughty, DO   1 year ago Type 2 diabetes mellitus with other specified complication, without long-term current use of insulin ([place])   [facility] Olin Hauser, DO   1 year ago Encounter for general medical examination   [place], DO       Future Appointments             In 4 months Parks Ranger, Devonne Doughty, DO [facility], [facility]              metFORMIN (GLUCOPHAGE) 500 MG tablet 90 tablet 1    Sig: TAKE 1 TABLET (500 MG TOTAL) BY MOUTH DAILY WITH SUPPER. TAKE WITH OTHER PILL 1000MG PER DAY      Endocrinology:  Diabetes - Biguanides Passed - 10/01/2019  1:25 AM      Passed - Cr in normal range and within 360 days    Creat  Date Value Ref Range Status  08/14/2019 0.77 0.50 - 1.05 mg/dL Final    Comment:    For patients >61 years of age, the reference limit for Creatinine is approximately 13% higher for people identified as African-American. .           Passed - HBA1C is between 0 and 7.9 and within 180 days    Hgb A1c MFr Bld  Date Value Ref Range Status  08/14/2019 6.2 (H) <5.7 % of total Hgb Final    Comment:    For someone without known diabetes, a hemoglobin  A1c value between 5.7% and 6.4% is consistent with prediabetes and should be confirmed with a  follow-up test. . For someone with known diabetes, a value  <7% indicates that their diabetes is well controlled. A1c targets should be individualized based on duration of diabetes, age, comorbid conditions, and other considerations. . This assay result is consistent with an increased risk of diabetes. . Currently, no consensus exists regarding use of hemoglobin A1c for diagnosis of diabetes for children. .           Passed - eGFR in normal range and within 360 days    GFR, Est African American  Date Value Ref Range Status  08/14/2019 103 > OR = 60 mL/min/1.28m Final   GFR, Est Non African American  Date Value Ref Range Status  08/14/2019 89 > OR = 60 mL/min/1.756mFinal          Passed - Valid encounter within last 6 months    Recent Outpatient Visits           1 month ago Type 2 diabetes mellitus with other specified complication, without long-term current use of insulin ([facility]  [place]   7 months ago Annual physical exam   [facility]   11 months ago Type 2 diabetes mellitus with other specified complication, without long-term current use of insulin ([facility]  [facility]   1 year ago Type 2 diabetes mellitus  with other specified complication, without long-term current use of insulin ([place])   [place], Devonne Doughty, DO   1 year ago Encounter for general medical examination   [place], DO       Future Appointments             In 4 months Parks Ranger, Devonne Doughty, DO [facility]              lisinopril-hydrochlorothiazide (ZESTORETIC) 20-12.5 MG tablet 90 tablet 1    Sig: TAKE 1 TABLET BY MOUTH EVERY DAY      Cardiovascular:  ACEI + Diuretic Combos Passed - 10/01/2019  1:25 AM      Passed - Na in normal range and within 180 days    Sodium  Date Value Ref Range Status  08/14/2019 140 135  - 146 mmol/L Final  07/22/2015 140 134 - 144 mmol/L Final          Passed - K in normal range and within 180 days    Potassium  Date Value Ref Range Status  08/14/2019 4.1 3.5 - 5.3 mmol/L Final          Passed - Cr in normal range and within 180 days    Creat  Date Value Ref Range Status  08/14/2019 0.77 0.50 - 1.05 mg/dL Final    Comment:    For patients >11 years of age, the reference limit for Creatinine is approximately 13% higher for people identified as African-American. .           Passed - Ca in normal range and within 180 days    Calcium  Date Value Ref Range Status  08/14/2019 10.0 8.6 - 10.4 mg/dL Final          Passed - Patient is not pregnant      Passed - Last BP in normal range    BP Readings from Last 1 Encounters:  02/20/19 125/85          Passed - Valid encounter within last 6 months    Recent Outpatient Visits           1 month ago Type 2 diabetes mellitus with other specified complication, without long-term current use of insulin ([place])   [facility], Devonne Doughty, DO   7 months ago Annual physical exam   [facility] Olin Hauser, DO   11 months ago Type 2 diabetes mellitus with other specified complication, without long-term current use of insulin [facility])   [facility], Devonne Doughty, DO   1 year ago Type 2 diabetes mellitus with other specified complication, without long-term current use of insulin ([place])   [facility] Parks Ranger, Devonne Doughty, DO   1 year ago Encounter for general medical examination   [place], DO       Future Appointments             In 4 months Parks Ranger, Devonne Doughty, DO [facility], [facility]

## 2019-10-01 NOTE — Telephone Encounter (Signed)
Changed Gabapentin from 300mg  x 2 to now Gabapentin 600mg  tablet, take 1 twice a day due to insurance request  , DO [facility] 10/01/2019, 5:12 PM

## 2019-10-22 ENCOUNTER — Encounter: Payer: Self-pay | Admitting: Family Medicine

## 2019-10-22 ENCOUNTER — Telehealth (INDEPENDENT_AMBULATORY_CARE_PROVIDER_SITE_OTHER): Payer: Self-pay | Admitting: Family Medicine

## 2019-10-22 ENCOUNTER — Other Ambulatory Visit: Payer: Self-pay

## 2019-10-22 DIAGNOSIS — J019 Acute sinusitis, unspecified: Secondary | ICD-10-CM

## 2019-10-22 DIAGNOSIS — J329 Chronic sinusitis, unspecified: Secondary | ICD-10-CM | POA: Insufficient documentation

## 2019-10-22 MED ORDER — DOXYCYCLINE HYCLATE 100 MG PO TABS: 100.0000 mg | ORAL_TABLET | Freq: Two times a day (BID) | ORAL | 0 refills | Status: DC

## 2019-10-22 NOTE — Assessment & Plan Note (Signed)
Likely acute sinusitis based on symptoms and progression of symptoms.  Will treat with antibiotics, can continue the over the counter claritin.  Discussed use of afrin x 3 days for assistance with nasal congestion, but to not use longer than 3 days for concerns of rebound nasal congestion.  Discussed using Coriciden HBP for symptoms of cold/congestion as is safe for patients with elevated blood pressure.  Plan: 1. Begin doxycycline 100mg  BID x 10 days 2. Continue claritin daily.   3. Can use afrin nasal spray up to 3 days in a row for nasal congestion 4. Can use neti-pot, with sterile water or water than has been boiled and cooled down, to help wash out sinuses 5. RTC if symptoms worsen or fail to improve with current treatment.

## 2019-10-22 NOTE — Progress Notes (Signed)
Virtual Visit via Telephone  The purpose of this virtual visit is to provide medical care while limiting exposure to the novel coronavirus (COVID19) for both patient and office staff.  Consent was obtained for phone visit:  Yes.   Answered questions that patient had about telehealth interaction:  Yes.   I discussed the limitations, risks, security and privacy concerns of performing an evaluation and management service by telephone. I also discussed with the patient that there may be a patient responsible charge related to this service. The patient expressed understanding and agreed to proceed.  Patient is at home and is accessed via telephone Services are provided by Charlaine Dalton, FNP-C from [facility])  ---------------------------------------------------------------------- Chief Complaint  Patient presents with  . Sore Throat    nasal congestion, headache, runny nose and productive cough x 6 days. Her boyfriend was recently seen at Urgent Care and diagnose with Upper Respiratory infection. Pt completed her COVID vaccine    S: Reviewed CMA documentation. I have called patient and gathered additional HPI as follows:  Barbara Bird presents to clinic via telemedicine for concerns of 6 days of symptoms including nasal congestion, runny nose, headache, watery eyes, productive cough.  States has some swollen lymph nodes around her neck/throat, feels that her symptoms wax/wane but that they are continuing to progress.  Has been exposed to a sick contact, her boyfriend, that has been diagnosed with an upper respiratory infection.  Has been sleeping sitting up, is unable to lay flat for sleep due to symptoms.  Reports having had her COVID vaccines.  Patient is currently home Denies any high risk travel to areas of current concern for COVID19. Denies any known or suspected exposure to person with or possibly with COVID19.  Denies any fevers, chills, sweats, body ache,  shortness of breath, abdominal pain, diarrhea  Past Medical History:  Diagnosis Date  . Hypertension    Social History   Tobacco Use  . Smoking status: Never Smoker  . Smokeless tobacco: Never Used  Substance Use Topics  . Alcohol use: Yes    Comment: occasional  . Drug use: No    Current Outpatient Medications:  .  atorvastatin (LIPITOR) 10 MG tablet, TAKE 1 TABLET BY MOUTH EVERY DAY, Disp: 90 tablet, Rfl: 1 .  carisoprodol (SOMA) 350 MG tablet, 1 po bid prn, Disp: , Rfl:  .  diclofenac (VOLTAREN) 75 MG EC tablet, TAKE 1 TABLET (75 MG TOTAL) BY MOUTH 2 (TWO) TIMES DAILY AS NEEDED. TAKE WITH FOOD., Disp: 60 tablet, Rfl: 2 .  dicyclomine (BENTYL) 10 MG capsule, Take 1 capsule (10 mg total) by mouth 4 (four) times daily -  before meals and at bedtime., Disp: 40 capsule, Rfl: 0 .  gabapentin (NEURONTIN) 600 MG tablet, Take 1 tablet (600 mg total) by mouth 2 (two) times daily., Disp: 180 tablet, Rfl: 1 .  guaiFENesin (MUCINEX) 600 MG 12 hr tablet, Take by mouth 2 (two) times daily., Disp: , Rfl:  .  ibuprofen (ADVIL,MOTRIN) 600 MG tablet, , Disp: , Rfl: 0 .  lisinopril-hydrochlorothiazide (ZESTORETIC) 20-12.5 MG tablet, TAKE 1 TABLET BY MOUTH EVERY DAY, Disp: 90 tablet, Rfl: 1 .  Menthol, Topical Analgesic, 10 % LIQD, , Disp: , Rfl:  .  metFORMIN (GLUCOPHAGE) 1000 MG tablet, TAKE 1 TABLET (1,000 MG TOTAL) BY MOUTH DAILY WITH BREAKFAST. TAKE WITH OTHER PILL 500MG  PER DAY, Disp: 90 tablet, Rfl: 1 .  metFORMIN (GLUCOPHAGE) 500 MG tablet, TAKE 1 TABLET (500 MG TOTAL)  BY MOUTH DAILY WITH SUPPER. TAKE WITH OTHER PILL 1000MG  PER DAY, Disp: 90 tablet, Rfl: 1 .  mometasone (NASONEX) 50 MCG/ACT nasal spray, , Disp: , Rfl: 0 .  Nerve Stimulator (STANDARD TENS) DEVI, by Does not apply route., Disp: , Rfl:  .  pseudoephedrine (SUDAFED) 120 MG 12 hr tablet, Take 120 mg by mouth 2 (two) times daily., Disp: , Rfl:  .  valACYclovir (VALTREX) 1000 MG tablet, Take 1 tablet (1,000 mg total) by mouth 2 (two)  times daily. For 5-10 days as needed for HSV flare, Disp: 10 tablet, Rfl: 2 .  doxycycline (VIBRA-TABS) 100 MG tablet, Take 1 tablet (100 mg total) by mouth 2 (two) times daily., Disp: 20 tablet, Rfl: 0  Depression screen [facility] 2/9 08/20/2019 02/20/2019 10/16/2018  Decreased Interest 0 0 0  Down, Depressed, Hopeless 0 0 0  PHQ - 2 Score 0 0 0  Altered sleeping - - -  Tired, decreased energy - - -  Change in appetite - - -  Feeling bad or failure about yourself  - - -  Trouble concentrating - - -  Moving slowly or fidgety/restless - - -  Suicidal thoughts - - -  PHQ-9 Score - - -  Difficult doing work/chores - - -    No flowsheet data found.  -------------------------------------------------------------------------- O: No physical exam performed due to remote telephone encounter.  Physical Exam: Patient remotely monitored without video.  Verbal communication appropriate.  Cognition normal.  Recent Results (from the past 2160 hour(s))  [facility] - BMET w/ GFR BMP Basic Metabolic Panel     Status: Abnormal   Collection Time: 08/14/19  9:04 AM  Result Value Ref Range   Glucose, Bld 129 (H) 65 - 99 mg/dL    Comment: .            Fasting reference interval . For someone without known diabetes, a glucose value >125 mg/dL indicates that they may have diabetes and this should be confirmed with a follow-up test. .    BUN 23 7 - 25 mg/dL   Creat 10/14/19 3.15 - 1.76 mg/dL    Comment: For patients >25 years of age, the reference limit for Creatinine is approximately 13% higher for people identified as African-American. .    GFR, Est Non African American 89 > OR = 60 mL/min/1.82m2   GFR, Est African American 103 > OR = 60 mL/min/1.66m2   BUN/Creatinine Ratio NOT APPLICABLE 6 - 22 (calc)   Sodium 140 135 - 146 mmol/L   Potassium 4.1 3.5 - 5.3 mmol/L   Chloride 105 98 - 110 mmol/L   CO2 25 20 - 32 mmol/L   Calcium 10.0 8.6 - 10.4 mg/dL  [facility] - Lipid panel physical     Status: Abnormal    Collection Time: 08/14/19  9:04 AM  Result Value Ref Range   Cholesterol 171 <200 mg/dL   HDL 35 (L) > OR = 50 mg/dL   Triglycerides 10/14/19 (H) <150 mg/dL    Comment: . If a non-fasting specimen was collected, consider repeat triglyceride testing on a fasting specimen if clinically indicated.  737 et al. J. of Clin. Lipidol. 2015;9:129-169. [place]    LDL Cholesterol (Calc)  mg/dL (calc)    Comment: . LDL cholesterol not calculated. Triglyceride levels greater than 400 mg/dL invalidate calculated LDL results. . Reference range: <100 . Desirable range <100 mg/dL for primary prevention;   <70 mg/dL for patients with CHD or diabetic patients  with > or =  2 CHD risk factors. Marland Kitchen LDL-C is now calculated using the Martin-Hopkins  calculation, which is a validated novel method providing  better accuracy than the Friedewald equation in the  estimation of LDL-C.  Horald Pollen et al. Lenox Ahr. 4193;790(24): 2061-2068  (http://education.QuestDiagnostics.com/faq/FAQ164)    Total CHOL/HDL Ratio 4.9 <5.0 (calc)   Non-HDL Cholesterol (Calc) 136 (H) <130 mg/dL (calc)    Comment: For patients with diabetes plus 1 major ASCVD risk  factor, treating to a non-HDL-C goal of <100 mg/dL  (LDL-C of <09 mg/dL) is considered a therapeutic  option.   [facility] - A1c LAB Hemoglobin A1C physical     Status: Abnormal   Collection Time: 08/14/19  9:04 AM  Result Value Ref Range   Hgb A1c MFr Bld 6.2 (H) <5.7 % of total Hgb    Comment: For someone without known diabetes, a hemoglobin  A1c value between 5.7% and 6.4% is consistent with prediabetes and should be confirmed with a  follow-up test. . For someone with known diabetes, a value <7% indicates that their diabetes is well controlled. A1c targets should be individualized based on duration of diabetes, age, comorbid conditions, and other considerations. . This assay result is consistent with an increased risk of diabetes. . Currently, no consensus exists  regarding use of hemoglobin A1c for diagnosis of diabetes for children. .    Mean Plasma Glucose 131 (calc)   eAG (mmol/L) 7.3 (calc)    -------------------------------------------------------------------------- A&P:  Problem List Items Addressed This Visit      Respiratory   Sinusitis - Primary    Likely acute sinusitis based on symptoms and progression of symptoms.  Will treat with antibiotics, can continue the over the counter claritin.  Discussed use of afrin x 3 days for assistance with nasal congestion, but to not use longer than 3 days for concerns of rebound nasal congestion.  Discussed using Coriciden HBP for symptoms of cold/congestion as is safe for patients with elevated blood pressure.  Plan: 1. Begin doxycycline 100mg  BID x 10 days 2. Continue claritin daily.   3. Can use afrin nasal spray up to 3 days in a row for nasal congestion 4. Can use neti-pot, with sterile water or water than has been boiled and cooled down, to help wash out sinuses 5. RTC if symptoms worsen or fail to improve with current treatment.      Relevant Medications   pseudoephedrine (SUDAFED) 120 MG 12 hr tablet   guaiFENesin (MUCINEX) 600 MG 12 hr tablet   doxycycline (VIBRA-TABS) 100 MG tablet      Meds ordered this encounter  Medications  . doxycycline (VIBRA-TABS) 100 MG tablet    Sig: Take 1 tablet (100 mg total) by mouth 2 (two) times daily.    Dispense:  20 tablet    Refill:  0    Follow-up: - Return as needed if symptoms worse or fail to improve  Patient verbalizes understanding with the above medical recommendations including the limitation of remote medical advice.  Specific follow-up and call-back criteria were given for patient to follow-up or seek medical care more urgently if needed.  - Time spent in direct consultation with patient on phone: 7 minutes  , FNP-C [facility] 10/22/2019, 3:03 PM

## 2019-10-22 NOTE — Patient Instructions (Signed)
As we discussed, treating as a bacterial infection.  I have sent in a prescription for Doxycycline 100mg  to take 1 tablet, twice a day for the next 10 days.  Can continue over the counter Claritin to help clear up secretions.  Can use over the counter Afrin for nasal congestion, using no more than 3 days in a row, to prevent rebound congestion.  Can use Neti-Pot to help wash out the sinuses.  Be sure to use sterile water or water that has been boiled and cooled down completely.  We will plan to see you back if symptoms worsen or fail to improve with current treatment plan.  You will receive a survey after today's visit either digitally by e-mail or paper by . Your experiences and feedback matter to [employer].  Please respond so we know how we are doing as we provide care for you.  Call us with any questions/concerns/needs.  It is my goal to be available to you for your health concerns.  Thanks for choosing me to be a partner in your healthcare needs!  [place], FNP-C Family Nurse Practitioner [facility] Phone: [phone]

## 2019-12-10 DIAGNOSIS — Z87820 Personal history of traumatic brain injury: Secondary | ICD-10-CM | POA: Insufficient documentation

## 2019-12-10 DIAGNOSIS — F4310 Post-traumatic stress disorder, unspecified: Secondary | ICD-10-CM | POA: Insufficient documentation

## 2019-12-18 ENCOUNTER — Encounter: Payer: Self-pay | Admitting: Family Medicine

## 2019-12-18 ENCOUNTER — Telehealth (INDEPENDENT_AMBULATORY_CARE_PROVIDER_SITE_OTHER): Payer: Self-pay | Admitting: Family Medicine

## 2019-12-18 ENCOUNTER — Ambulatory Visit: Payer: Self-pay | Admitting: *Deleted

## 2019-12-18 DIAGNOSIS — M5412 Radiculopathy, cervical region: Secondary | ICD-10-CM | POA: Insufficient documentation

## 2019-12-18 DIAGNOSIS — M62838 Other muscle spasm: Secondary | ICD-10-CM | POA: Insufficient documentation

## 2019-12-18 DIAGNOSIS — F0781 Postconcussional syndrome: Secondary | ICD-10-CM | POA: Insufficient documentation

## 2019-12-18 MED ORDER — TIZANIDINE HCL 2 MG PO TABS: 1.0000 mg | ORAL_TABLET | Freq: Four times a day (QID) | ORAL | 0 refills | Status: DC | PRN

## 2019-12-18 MED ORDER — PREDNISONE 20 MG PO TABS: 40.0000 mg | ORAL_TABLET | Freq: Every day | ORAL | 0 refills | Status: AC

## 2019-12-18 NOTE — Assessment & Plan Note (Signed)
See post concussive A/P 

## 2019-12-18 NOTE — Patient Instructions (Signed)
Start Tizanidine 2mg  (muscle relaxant) - use half or whole tablet every 6 hours as needed (may make you drowsy)  Can begin Tylenol to 1000mg  up to 3 times daily for breakthrough pain for 3-5 days then only as needed  Use moist heat or heating pad on shoulders / neck, and have family member help with soft tissue massage  Begin prednisone 40mg  daily for the next 5 days.  As we discussed, this medication can increase your glucose, so be mindful when checking your blood glucose levels and make better choices while you are taking this medication.  Please schedule a follow-up appointment with in 2 to 4 weeks if symptoms not improving, or sooner if worsening  If develop severe headaches, nausea / vomiting, loss of vision, numbness, weakness or tingling - call 911 or go immediately to ED.  A referral to Neurology has been placed today.  If you have not heard from the specialty office or our referral coordinator within 1 week, please let know and we will follow up with the referral coordinator for an update.  We will plan to see you back if your symptoms worsen or fail to improve  You will receive a survey after today's visit either digitally by e-mail or paper by USPS mail. Your experiences and feedback matter to .  Please respond so we know how we are doing as we provide care for you.  Call us with any questions/concerns/needs.  It is my goal to be available to you for your health concerns.  Thanks for choosing me to be a partner in your healthcare needs!  [place], FNP-C Family Nurse Practitioner [facility] Phone: [phone]

## 2019-12-18 NOTE — Assessment & Plan Note (Signed)
See post concussive A/P
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
## 2020-02-18 ENCOUNTER — Other Ambulatory Visit: Payer: Self-pay | Admitting: *Deleted

## 2020-02-18 DIAGNOSIS — I10 Essential (primary) hypertension: Secondary | ICD-10-CM

## 2020-02-18 DIAGNOSIS — E1169 Type 2 diabetes mellitus with other specified complication: Secondary | ICD-10-CM

## 2020-02-18 DIAGNOSIS — Z0000 Encounter for general adult medical examination without abnormal findings: Secondary | ICD-10-CM

## 2020-02-18 DIAGNOSIS — Z6841 Body Mass Index (BMI) 40.0 and over, adult: Secondary | ICD-10-CM

## 2020-02-19 ENCOUNTER — Other Ambulatory Visit: Payer: Self-pay

## 2020-02-20 LAB — CBC WITH DIFFERENTIAL/PLATELET
Absolute Monocytes: 574 cells/uL (ref 200–950)
Basophils Absolute: 82 cells/uL (ref 0–200)
Basophils Relative: 1 %
Eosinophils Absolute: 189 cells/uL (ref 15–500)
Eosinophils Relative: 2.3 %
HCT: 42.5 % (ref 35.0–45.0)
Hemoglobin: 14.6 g/dL (ref 11.7–15.5)
Lymphs Abs: 3026 cells/uL (ref 850–3900)
MCH: 31.7 pg (ref 27.0–33.0)
MCHC: 34.4 g/dL (ref 32.0–36.0)
MCV: 92.2 fL (ref 80.0–100.0)
MPV: 9.9 fL (ref 7.5–12.5)
Monocytes Relative: 7 %
Neutro Abs: 4330 cells/uL (ref 1500–7800)
Neutrophils Relative %: 52.8 %
Platelets: 305 10*3/uL (ref 140–400)
RBC: 4.61 10*6/uL (ref 3.80–5.10)
RDW: 12.9 % (ref 11.0–15.0)
Total Lymphocyte: 36.9 %
WBC: 8.2 10*3/uL (ref 3.8–10.8)

## 2020-02-20 LAB — COMPLETE METABOLIC PANEL WITH GFR
AG Ratio: 2.3 (calc) (ref 1.0–2.5)
ALT: 28 U/L (ref 6–29)
AST: 15 U/L (ref 10–35)
Albumin: 4.5 g/dL (ref 3.6–5.1)
Alkaline phosphatase (APISO): 72 U/L (ref 37–153)
BUN: 13 mg/dL (ref 7–25)
CO2: 24 mmol/L (ref 20–32)
Calcium: 9.3 mg/dL (ref 8.6–10.4)
Chloride: 103 mmol/L (ref 98–110)
Creat: 0.63 mg/dL (ref 0.50–1.05)
GFR, Est African American: 119 mL/min/{1.73_m2} (ref >=60)
GFR, Est Non African American: 102 mL/min/{1.73_m2} (ref >=60)
Globulin: 2 g/dL (calc) (ref 1.9–3.7)
Glucose, Bld: 173 mg/dL — ABNORMAL HIGH (ref 65–99)
Potassium: 3.9 mmol/L (ref 3.5–5.3)
Sodium: 138 mmol/L (ref 135–146)
Total Bilirubin: 0.5 mg/dL (ref 0.2–1.2)
Total Protein: 6.5 g/dL (ref 6.1–8.1)

## 2020-02-20 LAB — LIPID PANEL
Cholesterol: 161 mg/dL (ref <200)
HDL: 39 mg/dL — ABNORMAL LOW (ref >=50)
LDL Cholesterol (Calc): 85 mg/dL (calc)
Non-HDL Cholesterol (Calc): 122 mg/dL (calc) (ref <130)
Total CHOL/HDL Ratio: 4.1 (calc) (ref <5.0)
Triglycerides: 309 mg/dL — ABNORMAL HIGH (ref <150)

## 2020-02-20 LAB — HEMOGLOBIN A1C
Hgb A1c MFr Bld: 7.3 % of total Hgb — ABNORMAL HIGH (ref <5.7)
Mean Plasma Glucose: 163 (calc)
eAG (mmol/L): 9 (calc)

## 2020-02-20 LAB — TSH: TSH: 2.81 mIU/L

## 2020-02-26 ENCOUNTER — Other Ambulatory Visit: Payer: Self-pay

## 2020-02-26 ENCOUNTER — Ambulatory Visit (INDEPENDENT_AMBULATORY_CARE_PROVIDER_SITE_OTHER): Payer: BLUE CROSS/BLUE SHIELD | Admitting: Family Medicine

## 2020-02-26 ENCOUNTER — Encounter: Payer: Self-pay | Admitting: Family Medicine

## 2020-02-26 VITALS — BP 118/66 | HR 87 | Temp 97.7°F | Resp 16 | Ht 63.0 in | Wt 239.0 lb

## 2020-02-26 DIAGNOSIS — M8949 Other hypertrophic osteoarthropathy, multiple sites: Secondary | ICD-10-CM

## 2020-02-26 DIAGNOSIS — M5440 Lumbago with sciatica, unspecified side: Secondary | ICD-10-CM

## 2020-02-26 DIAGNOSIS — R413 Other amnesia: Secondary | ICD-10-CM

## 2020-02-26 DIAGNOSIS — E785 Hyperlipidemia, unspecified: Secondary | ICD-10-CM

## 2020-02-26 DIAGNOSIS — I10 Essential (primary) hypertension: Secondary | ICD-10-CM | POA: Diagnosis not present

## 2020-02-26 DIAGNOSIS — G8929 Other chronic pain: Secondary | ICD-10-CM

## 2020-02-26 DIAGNOSIS — E1169 Type 2 diabetes mellitus with other specified complication: Secondary | ICD-10-CM | POA: Diagnosis not present

## 2020-02-26 DIAGNOSIS — F0781 Postconcussional syndrome: Secondary | ICD-10-CM

## 2020-02-26 DIAGNOSIS — Z6841 Body Mass Index (BMI) 40.0 and over, adult: Secondary | ICD-10-CM

## 2020-02-26 DIAGNOSIS — M159 Polyosteoarthritis, unspecified: Secondary | ICD-10-CM

## 2020-02-26 DIAGNOSIS — Z0000 Encounter for general adult medical examination without abnormal findings: Secondary | ICD-10-CM | POA: Diagnosis not present

## 2020-02-26 DIAGNOSIS — M62838 Other muscle spasm: Secondary | ICD-10-CM

## 2020-02-26 DIAGNOSIS — M5136 Other intervertebral disc degeneration, lumbar region: Secondary | ICD-10-CM

## 2020-02-26 MED ORDER — GABAPENTIN 600 MG PO TABS: 600.0000 mg | ORAL_TABLET | Freq: Two times a day (BID) | ORAL | 3 refills | Status: DC

## 2020-02-26 MED ORDER — ATORVASTATIN CALCIUM 10 MG PO TABS: 10.0000 mg | ORAL_TABLET | Freq: Every day | ORAL | 3 refills | Status: DC

## 2020-02-26 MED ORDER — METFORMIN HCL 1000 MG PO TABS: 1000.0000 mg | ORAL_TABLET | Freq: Every day | ORAL | 3 refills | Status: DC

## 2020-02-26 MED ORDER — LISINOPRIL-HYDROCHLOROTHIAZIDE 20-12.5 MG PO TABS: 1.0000 | ORAL_TABLET | Freq: Every day | ORAL | 3 refills | Status: DC

## 2020-02-26 MED ORDER — TIZANIDINE HCL 2 MG PO TABS: 1.0000 mg | ORAL_TABLET | Freq: Four times a day (QID) | ORAL | 2 refills | Status: DC | PRN

## 2020-02-26 MED ORDER — METFORMIN HCL 500 MG PO TABS: 500.0000 mg | ORAL_TABLET | Freq: Every day | ORAL | 3 refills | Status: DC

## 2020-02-26 MED ORDER — DICLOFENAC SODIUM 1 % EX GEL: 2.0000 g | Freq: Four times a day (QID) | CUTANEOUS | 2 refills | Status: AC

## 2020-02-26 NOTE — Progress Notes (Signed)
Subjective:    Patient ID: Barbara Bird, female    DOB: [birth year], 54 y.o.   MRN: [id number]  Barbara Bird is a 54 y.o. female presenting on 02/26/2020 for Annual Exam   HPI   Here for Annual Physical and Lab Review.  CHRONIC DM, Type 2/ Morbid Obesity BMI >42 / Hyperlipidemia Prior A1c 6 range. Now last lab up to 7.3 CBGs:Not checking CBG Meds:Metformin 1000mg  AM / 500mg  PM Reports good compliance. Tolerating well w/o side-effects Currently on ACEi On Atorvastatin 10mg  daily, with good cholesterol results, low LDL < 70. Mild elevated TG still >200 Lifestyle: Weight improved - Diet (now eating more [employer] has limited diet now due to limitation with pain and motor movement, and unable to do meal prep) - Allergic to fish oil but hasn't tried plant based omega 3 - Due for DM Eye exam - has not gone due to COVID restrictions - she is anxious about going out to appointments. Denies hypoglycemia, blurry vision, polyuria  Post Concussion Syndrome Memory Loss Back Pain, Chronic/ Lumbar DDD / History of radiculitis See prior records with regard to chronic back pain  Pain, difficulty with lower extremity, arm, neck, back, now awaiting [facility]. issues cannot organize her medication on her own, she has to have pill pack or bags separately.  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 Cannot remember names. Word finding struggling.  Health Maintenance:  UTD Colonoscopy 03/2019  Future goal to pursue Mammogram, Diabetic Eye Exam when ready and COVID declined.  Depression screen [facility] 2/9 02/26/2020 08/20/2019 02/20/2019  Decreased Interest 0 0 0  Down, Depressed, Hopeless 0 0 0  PHQ - 2 Score 0 0 0  Altered sleeping - - -  Tired, decreased energy - - -  Change in appetite - - -  Feeling bad or failure about yourself  - - -  Trouble concentrating - - -  Moving slowly or fidgety/restless - - -  Suicidal thoughts - - -  PHQ-9 Score - -  -  Difficult doing work/chores - - -   No flowsheet data found.    Past Medical History:  Diagnosis Date  . Hypertension    Past Surgical History:  Procedure Laterality Date  . GALLBLADDER SURGERY    . TUBAL LIGATION     Social History   Socioeconomic History  . Marital status: Married    Spouse name: Not on file  . Number of children: Not on file  . Years of education: Not on file  . Highest education level: Not on file  Occupational History  . Not on file  Tobacco Use  . Smoking status: Never Smoker  . Smokeless tobacco: Never Used  Substance and Sexual Activity  . Alcohol use: Yes    Comment: occasional  . Drug use: No  . Sexual activity: Yes    Birth control/protection: Surgical  Other Topics Concern  . Not on file  Social History Narrative  . Not on file   Social Determinants of Health   Financial Resource Strain:   . Difficulty of Paying Living Expenses: Not on file  Food Insecurity:   . Worried About 02/28/2020 in the Last Year: Not on file  . Ran Out of Food in the Last Year: Not on file  Transportation Needs:   . Lack of Transportation (Medical): Not on file  . Lack of Transportation (Non-Medical): Not on file  Physical Activity:   . Days of Exercise  per Week: Not on file  . Minutes of Exercise per Session: Not on file  Stress:   . Feeling of Stress : Not on file  Social Connections:   . Frequency of Communication with Friends and Family: Not on file  . Frequency of Social Gatherings with Friends and Family: Not on file  . Attends Religious Services: Not on file  . Active Member of Clubs or Organizations: Not on file  . Attends Banker Meetings: Not on file  . Marital Status: Not on file  Intimate Partner Violence:   . Fear of Current or Ex-Partner: Not on file  . Emotionally Abused: Not on file  . Physically Abused: Not on file  . Sexually Abused: Not on file   Family History  Problem Relation Age of Onset  . Stroke  Father 45  . Diabetes Father   . Cancer Maternal Aunt        [employer]  . Mental illness Maternal Grandmother    Current Outpatient Medications on File Prior to Visit  Medication Sig  . diclofenac (VOLTAREN) 75 MG EC tablet TAKE 1 TABLET (75 MG TOTAL) BY MOUTH 2 (TWO) TIMES DAILY AS NEEDED. TAKE WITH FOOD.  Marland Kitchen lidocaine (LIDODERM) 5 % Place onto the skin.  . Menthol, Topical Analgesic, 10 % LIQD   . mometasone (NASONEX) 50 MCG/ACT nasal spray   . Nerve Stimulator (STANDARD TENS) DEVI by Does not apply route.  . nortriptyline (PAMELOR) 10 MG capsule Take 10 mg by mouth 2 (two) times daily.  . valACYclovir (VALTREX) 1000 MG tablet Take 1 tablet (1,000 mg total) by mouth 2 (two) times daily. For 5-10 days as needed for HSV flare   No current facility-administered medications on file prior to visit.    Review of Systems  Constitutional: Negative for activity change, appetite change, chills, diaphoresis, fatigue and fever.  HENT: Negative for congestion and hearing loss.   Eyes: Negative for visual disturbance.  Respiratory: Negative for cough, chest tightness, shortness of breath and wheezing.   Cardiovascular: Negative for chest pain, palpitations and leg swelling.  Gastrointestinal: Negative for abdominal pain, constipation, diarrhea, nausea and vomiting.  Endocrine: Negative for cold intolerance.  Genitourinary: Negative for difficulty urinating, dysuria, frequency and hematuria.  Musculoskeletal: Positive for arthralgias and back pain. Negative for neck pain.  Skin: Negative for rash.  Allergic/Immunologic: Negative for environmental allergies.  Neurological: Negative for dizziness, weakness, light-headedness, numbness and headaches.       Memory loss  Hematological: Negative for adenopathy.  Psychiatric/Behavioral: Negative for behavioral problems, decreased concentration, dysphoric mood and sleep disturbance. The patient is not nervous/anxious.    Per HPI unless specifically  indicated above     Objective:    BP 118/66   Pulse 87   Temp 97.7 F (36.5 C) (Temporal)   Resp 16   Ht 5\' 3"  (1.6 m)   Wt 239 lb (108.4 kg) Comment: unalbe to weight --estimation  SpO2 98%   BMI 42.34 kg/m   Wt Readings from Last 3 Encounters:  02/26/20 239 lb (108.4 kg)  02/20/19 235 lb 6.4 oz (106.8 kg)  04/14/18 247 lb (112 kg)    Physical Exam Vitals and nursing note reviewed.  Constitutional:      General: She is not in acute distress.    Appearance: She is well-developed. She is obese. She is not diaphoretic.     Comments: Well-appearing, comfortable, cooperative  HENT:     Head: Normocephalic and atraumatic.  Eyes:  General:        Right eye: No discharge.        Left eye: No discharge.     Conjunctiva/sclera: Conjunctivae normal.     Pupils: Pupils are equal, round, and reactive to light.  Neck:     Thyroid: No thyromegaly.  Cardiovascular:     Rate and Rhythm: Normal rate and regular rhythm.     Heart sounds: Normal heart sounds. No murmur heard.   Pulmonary:     Effort: Pulmonary effort is normal. No respiratory distress.     Breath sounds: Normal breath sounds. No wheezing or rales.  Abdominal:     General: Bowel sounds are normal. There is no distension.     Palpations: Abdomen is soft. There is no mass.     Tenderness: There is no abdominal tenderness.  Musculoskeletal:     Cervical back: Normal range of motion and neck supple.     Comments: Limited MSK exam due to persistent pain difficulty with positioning due to pain, in wheelchair. Has limited range of motion hips, and back.  Lymphadenopathy:     Cervical: No cervical adenopathy.  Skin:    General: Skin is warm and dry.     Findings: No erythema or rash.  Neurological:     Mental Status: She is alert and oriented to person, place, and time.     Comments: Distal sensation intact to light touch all extremities  Psychiatric:        Behavior: Behavior normal.     Comments: Well groomed,  good eye contact, normal speech and thoughts      Diabetic Foot Exam - Simple   Simple Foot Form Diabetic Foot exam was performed with the following findings: Yes 02/26/2020 10:21 AM  Visual Inspection No deformities, no ulcerations, no other skin breakdown bilaterally: Yes Sensation Testing Intact to touch and monofilament testing bilaterally: Yes Pulse Check Posterior Tibialis and Dorsalis pulse intact bilaterally: Yes Comments     Results for orders placed or performed in visit on 02/18/20  TSH  Result Value Ref Range   TSH 2.81 mIU/L  Lipid panel  Result Value Ref Range   Cholesterol 161 <200 mg/dL   HDL 39 (L) > OR = 50 mg/dL   Triglycerides 409 (H) <150 mg/dL   LDL Cholesterol (Calc) 85 mg/dL (calc)   Total CHOL/HDL Ratio 4.1 <5.0 (calc)   Non-HDL Cholesterol (Calc) 122 <130 mg/dL (calc)  COMPLETE METABOLIC PANEL WITH GFR  Result Value Ref Range   Glucose, Bld 173 (H) 65 - 99 mg/dL   BUN 13 7 - 25 mg/dL   Creat 8.11 9.14 - 7.82 mg/dL   GFR, Est Non African American 102 > OR = 60 mL/min/1.58m2   GFR, Est African American 119 > OR = 60 mL/min/1.45m2   BUN/Creatinine Ratio NOT APPLICABLE 6 - 22 (calc)   Sodium 138 135 - 146 mmol/L   Potassium 3.9 3.5 - 5.3 mmol/L   Chloride 103 98 - 110 mmol/L   CO2 24 20 - 32 mmol/L   Calcium 9.3 8.6 - 10.4 mg/dL   Total Protein 6.5 6.1 - 8.1 g/dL   Albumin 4.5 3.6 - 5.1 g/dL   Globulin 2.0 1.9 - 3.7 g/dL (calc)   AG Ratio 2.3 1.0 - 2.5 (calc)   Total Bilirubin 0.5 0.2 - 1.2 mg/dL   Alkaline phosphatase (APISO) 72 37 - 153 U/L   AST 15 10 - 35 U/L   ALT 28 6 - 29 U/L  CBC with Differential/Platelet  Result Value Ref Range   WBC 8.2 3.8 - 10.8 Thousand/uL   RBC 4.61 3.80 - 5.10 Million/uL   Hemoglobin 14.6 11.7 - 15.5 g/dL   HCT 70.9 35 - 45 %   MCV 92.2 80.0 - 100.0 fL   MCH 31.7 27.0 - 33.0 pg   MCHC 34.4 32.0 - 36.0 g/dL   RDW 62.8 36.6 - 29.4 %   Platelets 305 140 - 400 Thousand/uL   MPV 9.9 7.5 - 12.5 fL   Neutro  Abs 4,330 1,500 - 7,800 cells/uL   Lymphs Abs 3,026 850 - 3,900 cells/uL   Absolute Monocytes 574 200 - 950 cells/uL   Eosinophils Absolute 189 15.0 - 500.0 cells/uL   Basophils Absolute 82 0.0 - 200.0 cells/uL   Neutrophils Relative % 52.8 %   Total Lymphocyte 36.9 %   Monocytes Relative 7.0 %   Eosinophils Relative 2.3 %   Basophils Relative 1.0 %  Hemoglobin A1c  Result Value Ref Range   Hgb A1c MFr Bld 7.3 (H) <5.7 % of total Hgb   Mean Plasma Glucose 163 (calc)   eAG (mmol/L) 9.0 (calc)      Assessment & Plan:   Problem List Items Addressed This Visit    Type 2 diabetes mellitus with other specified complication (HCC)   Relevant Medications   atorvastatin (LIPITOR) 10 MG tablet   lisinopril-hydrochlorothiazide (ZESTORETIC) 20-12.5 MG tablet   metFORMIN (GLUCOPHAGE) 1000 MG tablet   metFORMIN (GLUCOPHAGE) 500 MG tablet   Post concussive syndrome   Relevant Medications   nortriptyline (PAMELOR) 10 MG capsule   gabapentin (NEURONTIN) 600 MG tablet   tiZANidine (ZANAFLEX) 2 MG tablet   Muscle spasm   Relevant Medications   tiZANidine (ZANAFLEX) 2 MG tablet   Morbid obesity with BMI of 40.0-44.9, adult (HCC)   Relevant Medications   metFORMIN (GLUCOPHAGE) 1000 MG tablet   metFORMIN (GLUCOPHAGE) 500 MG tablet   Memory loss   Hyperlipidemia associated with type 2 diabetes mellitus (HCC)   Relevant Medications   atorvastatin (LIPITOR) 10 MG tablet   lisinopril-hydrochlorothiazide (ZESTORETIC) 20-12.5 MG tablet   metFORMIN (GLUCOPHAGE) 1000 MG tablet   metFORMIN (GLUCOPHAGE) 500 MG tablet   HTN (hypertension)   Relevant Medications   atorvastatin (LIPITOR) 10 MG tablet   lisinopril-hydrochlorothiazide (ZESTORETIC) 20-12.5 MG tablet   DDD (degenerative disc disease), lumbar   Relevant Medications   diclofenac Sodium (VOLTAREN) 1 % GEL   tiZANidine (ZANAFLEX) 2 MG tablet   Back pain, chronic   Relevant Medications   nortriptyline (PAMELOR) 10 MG capsule   gabapentin  (NEURONTIN) 600 MG tablet   tiZANidine (ZANAFLEX) 2 MG tablet    Other Visit Diagnoses    Annual physical exam    -  Primary   Primary osteoarthritis involving multiple joints       Relevant Medications   diclofenac Sodium (VOLTAREN) 1 % GEL   tiZANidine (ZANAFLEX) 2 MG tablet   Essential hypertension       Relevant Medications   atorvastatin (LIPITOR) 10 MG tablet   lisinopril-hydrochlorothiazide (ZESTORETIC) 20-12.5 MG tablet      Updated Health Maintenance information Reviewed recent lab results with patient   Elevated A1c to 7.3, prior 6.2 - discussed likely with dietary change and sedentary likely cause. Continue current medication regimen on metformin. DM Foot exam done today, negative.  Encouraged improvement to lifestyle with diet and exercise Goal of weight loss  All refills completed.  #Chronic pain Re order medications, including  Gabapentin, Tizanidine as prescribed Re order Diclofenac topical may use goodrx if not covered - Awaiting chronic pain management  #Post concussive syndrome Complex course, [facility], having complicated course with memory loss - discussed benefit from pill box to help organize her pills, but she declines - currently using a system with boyfriend filling small bag for AM and PM medications, he is helping with her medications currently.   Meds ordered this encounter  Medications  . diclofenac Sodium (VOLTAREN) 1 % GEL    Sig: Apply 2 g topically 4 (four) times daily.    Dispense:  100 g    Refill:  2  . atorvastatin (LIPITOR) 10 MG tablet    Sig: Take 1 tablet (10 mg total) by mouth daily.    Dispense:  90 tablet    Refill:  3  . gabapentin (NEURONTIN) 600 MG tablet    Sig: Take 1 tablet (600 mg total) by mouth 2 (two) times daily.    Dispense:  180 tablet    Refill:  3  . lisinopril-hydrochlorothiazide (ZESTORETIC) 20-12.5 MG tablet    Sig: Take 1 tablet by mouth daily.    Dispense:  90 tablet     Refill:  3  . metFORMIN (GLUCOPHAGE) 1000 MG tablet    Sig: Take 1 tablet (1,000 mg total) by mouth daily with breakfast. Take with other pill 500mg  per day    Dispense:  90 tablet    Refill:  3  . metFORMIN (GLUCOPHAGE) 500 MG tablet    Sig: Take 1 tablet (500 mg total) by mouth daily with supper. Take with other pill 1000mg  per day    Dispense:  90 tablet    Refill:  3  . tiZANidine (ZANAFLEX) 2 MG tablet    Sig: Take 0.5-1 tablets (1-2 mg total) by mouth every 6 (six) hours as needed for muscle spasms.    Dispense:  30 tablet    Refill:  2     Follow up plan: Return in about 6 months (around 08/25/2020) for 6 month follow-up DM A1c.  Saralyn PilarAlexander Darell Saputo, DO [facility] 02/26/2020, 10:04 AM

## 2020-02-26 NOTE — Patient Instructions (Addendum)
Thank you for coming to the office today.  Think about a day/night or a weekly pillbox if you are interested for safety.  Recent Labs    08/14/19 0904 02/19/20 0812  HGBA1C 6.2* 7.3*   Keep on current Metformin dose at this time.  Refilled meds  Use Goodrx for Diclofenac topical   Please schedule a Follow-up Appointment to: Return in about 6 months (around 08/25/2020) for 6 month follow-up DM A1c.  If you have any other questions or concerns, please feel free to call the office or send a message through MyChart. You may also schedule an earlier appointment if necessary.  Additionally, you may be receiving a survey about your experience at our office within a few days to 1 week by e-mail or mail. We value your feedback.  Saralyn Pilar, DO [facility], [place]

## 2020-06-04 LAB — HM DIABETES EYE EXAM

## 2020-06-12 ENCOUNTER — Encounter: Payer: Self-pay | Admitting: Family Medicine

## 2020-07-23 DIAGNOSIS — A6000 Herpesviral infection of urogenital system, unspecified: Secondary | ICD-10-CM

## 2020-07-23 MED ORDER — VALACYCLOVIR HCL 1 G PO TABS: 1000.0000 mg | ORAL_TABLET | Freq: Two times a day (BID) | ORAL | 2 refills | Status: DC

## 2020-08-25 ENCOUNTER — Ambulatory Visit: Payer: BLUE CROSS/BLUE SHIELD | Admitting: Family Medicine

## 2020-09-10 ENCOUNTER — Other Ambulatory Visit: Payer: Self-pay

## 2020-09-10 ENCOUNTER — Ambulatory Visit: Payer: BLUE CROSS/BLUE SHIELD | Admitting: Family Medicine

## 2020-09-10 DIAGNOSIS — E1169 Type 2 diabetes mellitus with other specified complication: Secondary | ICD-10-CM

## 2020-09-11 LAB — HEMOGLOBIN A1C
Hgb A1c MFr Bld: 11.3 % of total Hgb — ABNORMAL HIGH (ref <5.7)
Mean Plasma Glucose: 278 mg/dL
eAG (mmol/L): 15.4 mmol/L

## 2020-09-11 NOTE — Progress Notes (Signed)
Re=-scheduled apt.  Saralyn Pilar, DO [facility] 09/11/2020, 12:36 PM

## 2020-09-12 ENCOUNTER — Encounter: Payer: Self-pay | Admitting: Family Medicine

## 2020-09-12 ENCOUNTER — Telehealth (INDEPENDENT_AMBULATORY_CARE_PROVIDER_SITE_OTHER): Payer: BLUE CROSS/BLUE SHIELD | Admitting: Family Medicine

## 2020-09-12 ENCOUNTER — Other Ambulatory Visit: Payer: Self-pay

## 2020-09-12 VITALS — Ht 63.0 in | Wt 239.0 lb

## 2020-09-12 DIAGNOSIS — G44329 Chronic post-traumatic headache, not intractable: Secondary | ICD-10-CM | POA: Insufficient documentation

## 2020-09-12 DIAGNOSIS — R413 Other amnesia: Secondary | ICD-10-CM

## 2020-09-12 DIAGNOSIS — F0781 Postconcussional syndrome: Secondary | ICD-10-CM

## 2020-09-12 DIAGNOSIS — Z6841 Body Mass Index (BMI) 40.0 and over, adult: Secondary | ICD-10-CM

## 2020-09-12 DIAGNOSIS — E1169 Type 2 diabetes mellitus with other specified complication: Secondary | ICD-10-CM | POA: Diagnosis not present

## 2020-09-12 MED ORDER — RYBELSUS 3 MG PO TABS
3.0000 mg | ORAL_TABLET | Freq: Every day | ORAL | 0 refills | Status: DC
Start: 2020-09-12 — End: 2020-10-06
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
## 2020-09-12 NOTE — Patient Instructions (Addendum)
Recent Labs    02/19/20 0812 09/10/20 0909  HGBA1C 7.3* 11.3*   Elevated blood sugar up to 11.3  Starting dose is 3mg  once daily, first thing in morning on empty stomach, sip of water, no other medicines. Wait 30 min before first meal of day.  After 30 days we need to increase dose up to 7 mg - call to request new rx.  Go to "Rybelsus" website to go to patient savings  https://www.rybelsus.com/savings-[email or web address]-support.html  https://www.novocare.com/rybelsus/savings-card.html  Continue to take metformin medication.  If med is too high cost we can refer to our Clinical Pharmacist to help work on financial assistance with medication.  Please schedule a Follow-up Appointment to: No follow-ups on file.  If you have any other questions or concerns, please feel free to call the office or send a message through MyChart. You may also schedule an earlier appointment if necessary.  Additionally, you may be receiving a survey about your experience at our office within a few days to 1 week by e-mail or mail. We value your feedback.  [place], DO [facility], [facility]

## 2020-10-04 ENCOUNTER — Other Ambulatory Visit: Payer: Self-pay | Admitting: Family Medicine

## 2020-10-04 DIAGNOSIS — E1169 Type 2 diabetes mellitus with other specified complication: Secondary | ICD-10-CM

## 2020-10-04 NOTE — Telephone Encounter (Signed)
Requested medication (s) are due for refill today: no  Requested medication (s) are on the active medication list: yes    Last refill:  09/12/20 #30    expired: 10/12/20  Future visit scheduled: no  Notes to clinic:  medication not assigned to a protocol   Requested Prescriptions  Pending Prescriptions Disp Refills   RYBELSUS 3 MG TABS [Pharmacy Med Name: RYBELSUS 3 MG TABLET] 30 tablet 0    Sig: TAKE 1 TAB (3 MG) BY MOUTH DAILY BEFORE BREAKFAST. TAKE 30 MIN BEFORE BREAKFAST, DO NOT TAKE WITH OTHER MEDICATIONS. DOSE INCREASE TO 7MG  AFTER FIRST 30 DAYS.      Off-Protocol Failed - 10/04/2020 12:03 PM      Failed - Medication not assigned to a protocol, review manually.      Passed - Valid encounter within last 12 months    Recent Outpatient Visits           3 weeks ago Type 2 diabetes mellitus with other specified complication, without long-term current use of insulin [facility])   [facility], [place], DO   3 weeks ago Type 2 diabetes mellitus with other specified complication, without long-term current use of insulin [facility])   [facility], DO   7 months ago Annual physical exam   [facility], DO   9 months ago Post concussive syndrome   [facility], [facility], FNP   11 months ago Acute non-recurrent sinusitis, unspecified location   [facility], [facility], Jodelle Gross

## 2020-12-01 ENCOUNTER — Encounter: Payer: Self-pay | Admitting: Family Medicine

## 2020-12-01 DIAGNOSIS — G894 Chronic pain syndrome: Secondary | ICD-10-CM

## 2021-01-29 ENCOUNTER — Encounter: Payer: Self-pay | Admitting: Family Medicine

## 2021-01-29 DIAGNOSIS — M5136 Other intervertebral disc degeneration, lumbar region: Secondary | ICD-10-CM

## 2021-01-29 DIAGNOSIS — M5412 Radiculopathy, cervical region: Secondary | ICD-10-CM

## 2021-01-29 DIAGNOSIS — G894 Chronic pain syndrome: Secondary | ICD-10-CM

## 2021-01-29 DIAGNOSIS — F0781 Postconcussional syndrome: Secondary | ICD-10-CM

## 2021-03-29 ENCOUNTER — Other Ambulatory Visit: Payer: Self-pay | Admitting: Family Medicine

## 2021-03-29 DIAGNOSIS — E785 Hyperlipidemia, unspecified: Secondary | ICD-10-CM

## 2021-03-29 DIAGNOSIS — E1169 Type 2 diabetes mellitus with other specified complication: Secondary | ICD-10-CM

## 2021-03-29 DIAGNOSIS — I10 Essential (primary) hypertension: Secondary | ICD-10-CM

## 2021-03-30 ENCOUNTER — Other Ambulatory Visit: Payer: Self-pay | Admitting: Family Medicine

## 2021-03-30 DIAGNOSIS — E1169 Type 2 diabetes mellitus with other specified complication: Secondary | ICD-10-CM

## 2021-03-30 DIAGNOSIS — Z6841 Body Mass Index (BMI) 40.0 and over, adult: Secondary | ICD-10-CM

## 2021-03-30 DIAGNOSIS — M5136 Other intervertebral disc degeneration, lumbar region: Secondary | ICD-10-CM

## 2021-03-30 DIAGNOSIS — M5440 Lumbago with sciatica, unspecified side: Secondary | ICD-10-CM

## 2021-03-30 DIAGNOSIS — Z0000 Encounter for general adult medical examination without abnormal findings: Secondary | ICD-10-CM

## 2021-03-30 DIAGNOSIS — I10 Essential (primary) hypertension: Secondary | ICD-10-CM

## 2021-03-30 DIAGNOSIS — E785 Hyperlipidemia, unspecified: Secondary | ICD-10-CM

## 2021-03-30 NOTE — Telephone Encounter (Signed)
Pt was scheduled appt on 04/08/21 at 1120 via VV per pt request. Lab appt scheduled on 04/07/21 at 0815.

## 2021-03-30 NOTE — Telephone Encounter (Signed)
Requested medication (s) are due for refill today: Yes  Requested medication (s) are on the active medication list: Yes  Last refill:  02/26/20 #90/3RF  Future visit scheduled: yes 04/08/21  Notes to clinic:  pt was scheduled for lab appt on 12/27, please place order in for labs to refill meds.      Requested Prescriptions  Pending Prescriptions Disp Refills   metFORMIN (GLUCOPHAGE) 1000 MG tablet [Pharmacy Med Name: METFORMIN HCL 1,000 MG TABLET] 90 tablet 3    Sig: Take 1 tablet (1,000 mg total) by mouth daily with breakfast. Take with other pill 5108m per day     Endocrinology:  Diabetes - Biguanides Failed - 03/29/2021  9:25 AM      Failed - Cr in normal range and within 360 days    Creat  Date Value Ref Range Status  02/19/2020 0.63 0.50 - 1.05 mg/dL Final    Comment:    For patients >46years of age, the reference limit for Creatinine is approximately 13% higher for people identified as African-American. .           Failed - HBA1C is between 0 and 7.9 and within 180 days    Hgb A1c MFr Bld  Date Value Ref Range Status  09/10/2020 11.3 (H) <5.7 % of total Hgb Final    Comment:    For someone without known diabetes, a hemoglobin A1c value of 6.5% or greater indicates that they may have  diabetes and this should be confirmed with a follow-up  test. . For someone with known diabetes, a value <7% indicates  that their diabetes is well controlled and a value  greater than or equal to 7% indicates suboptimal  control. A1c targets should be individualized based on  duration of diabetes, age, comorbid conditions, and  other considerations. . Currently, no consensus exists regarding use of hemoglobin A1c for diagnosis of diabetes for children. .           Failed - eGFR in normal range and within 360 days    GFR, Est African American  Date Value Ref Range Status  02/19/2020 119 > OR = 60 mL/min/1.77mFinal   GFR, Est Non African American  Date Value Ref Range  Status  02/19/2020 102 > OR = 60 mL/min/1.7380minal          Failed - Valid encounter within last 6 months    Recent Outpatient Visits           6 months ago Type 2 diabetes mellitus with other specified complication, without long-term current use of insulin ([facility] [place]   6 months ago Type 2 diabetes mellitus with other specified complication, without long-term current use of insulin ([facility]   1 year ago Annual physical exam   [place]ou[place]O   1 year ago Post concussive syndrome   [facility] Raider[facility]   1 year ago Acute non-recurrent sinusitis, unspecified location   [place]ou[place]       Future Appointments             In 1 week KarParks RangerleDevonne DoughtyO [facility]             atorvastatin (LIPITOR) 10 MG tablet [Pharmacy Med Name: ATORVASTATIN 10 MG TABLET] 90 tablet  3    Sig: TAKE 1 TABLET BY MOUTH EVERY DAY     Cardiovascular:  Antilipid - Statins Failed - 03/29/2021  9:25 AM      Failed - Total Cholesterol in normal range and within 360 days    Cholesterol, Total  Date Value Ref Range Status  04/02/2015 97 (L) 100 - 199 mg/dL Final   Cholesterol  Date Value Ref Range Status  02/19/2020 161 <200 mg/dL Final          Failed - LDL in normal range and within 360 days    LDL Cholesterol (Calc)  Date Value Ref Range Status  02/19/2020 85 mg/dL (calc) Final    Comment:    Reference range: <100 . Desirable range <100 mg/dL for primary prevention;   <70 mg/dL for patients with CHD or diabetic patients  with > or = 2 CHD risk factors. Marland Kitchen LDL-C is now calculated using the Martin-Hopkins  calculation, which is a validated novel method providing  better accuracy than the Friedewald equation in the  estimation of LDL-C.  Cresenciano Genre et al. Annamaria Helling. 6378;588(50): 2061-2068  (http://education.QuestDiagnostics.com/faq/FAQ164)           Failed - HDL in normal range and within 360 days    HDL  Date Value Ref Range Status  02/19/2020 39 (L) > OR = 50 mg/dL Final  04/02/2015 32 (L) >39 mg/dL Final          Failed - Triglycerides in normal range and within 360 days    Triglycerides  Date Value Ref Range Status  02/19/2020 309 (H) <150 mg/dL Final    Comment:    . If a non-fasting specimen was collected, consider repeat triglyceride testing on a fasting specimen if clinically indicated.  Yates Decamp et al. J. of Clin. Lipidol. 2774;1:287-867. Marland Kitchen           Passed - Patient is not pregnant      Passed - Valid encounter within last 12 months    Recent Outpatient Visits           6 months ago Type 2 diabetes mellitus with other specified complication, without long-term current use of insulin ([place])   [facility], Devonne Doughty, DO   6 months ago Type 2 diabetes mellitus with other specified complication, without long-term current use of insulin ([place])   [place], Devonne Doughty, DO   1 year ago Annual physical exam   [facility] Olin Hauser, DO   1 year ago Post concussive syndrome   [facility], Lupita Raider, FNP   1 year ago Acute non-recurrent sinusitis, unspecified location   [facility], Lupita Raider, FNP       Future Appointments             In 1 week Parks Ranger, Devonne Doughty, DO [facility], [facility]             lisinopril-hydrochlorothiazide (ZESTORETIC) 20-12.5 MG tablet [Pharmacy Med Name: LISINOPRIL-HCTZ 20-12.5 MG TAB] 90 tablet 3    Sig: TAKE 1 TABLET BY MOUTH EVERY DAY     Cardiovascular:  ACEI + Diuretic Combos Failed - 03/29/2021  9:25 AM      Failed - Na in normal range and within 180 days    Sodium  Date Value Ref Range Status  02/19/2020 138 135 - 146  mmol/L Final  07/22/2015 140 134 - 144 mmol/L Final  Failed - K in normal range and within 180 days    Potassium  Date Value Ref Range Status  02/19/2020 3.9 3.5 - 5.3 mmol/L Final          Failed - Cr in normal range and within 180 days    Creat  Date Value Ref Range Status  02/19/2020 0.63 0.50 - 1.05 mg/dL Final    Comment:    For patients >41 years of age, the reference limit for Creatinine is approximately 13% higher for people identified as African-American. .           Failed - Ca in normal range and within 180 days    Calcium  Date Value Ref Range Status  02/19/2020 9.3 8.6 - 10.4 mg/dL Final          Failed - Valid encounter within last 6 months    Recent Outpatient Visits           6 months ago Type 2 diabetes mellitus with other specified complication, without long-term current use of insulin ([place])   [place], DO   6 months ago Type 2 diabetes mellitus with other specified complication, without long-term current use of insulin ([place])   [place], Devonne Doughty, DO   1 year ago Annual physical exam   [place], DO   1 year ago Post concussive syndrome   [facility], Lupita Raider, FNP   1 year ago Acute non-recurrent sinusitis, unspecified location   [facility], Lupita Raider, FNP       Future Appointments             In 1 week Parks Ranger, Devonne Doughty, [facility], [place] - Patient is not pregnant      Passed - Last BP in normal range    BP Readings from Last 1 Encounters:  02/26/20 118/66           metFORMIN (GLUCOPHAGE) 500 MG tablet [Pharmacy Med Name: METFORMIN HCL 500 MG TABLET] 90 tablet 3    Sig: Take 1 tablet (500 mg total) by mouth daily with supper. Take with other pill 1070m per day     Endocrinology:  Diabetes - Biguanides Failed -  03/29/2021  9:25 AM      Failed - Cr in normal range and within 360 days    Creat  Date Value Ref Range Status  02/19/2020 0.63 0.50 - 1.05 mg/dL Final    Comment:    For patients >458years of age, the reference limit for Creatinine is approximately 13% higher for people identified as African-American. .           Failed - HBA1C is between 0 and 7.9 and within 180 days    Hgb A1c MFr Bld  Date Value Ref Range Status  09/10/2020 11.3 (H) <5.7 % of total Hgb Final    Comment:    For someone without known diabetes, a hemoglobin A1c value of 6.5% or greater indicates that they may have  diabetes and this should be confirmed with a follow-up  test. . For someone with known diabetes, a value <7% indicates  that their diabetes is well controlled and a value  greater than or equal to 7% indicates suboptimal  control. A1c targets should be individualized based on  duration of diabetes,  age, comorbid conditions, and  other considerations. . Currently, no consensus exists regarding use of hemoglobin A1c for diagnosis of diabetes for children. .           Failed - eGFR in normal range and within 360 days    GFR, Est African American  Date Value Ref Range Status  02/19/2020 119 > OR = 60 mL/min/1.28m Final   GFR, Est Non African American  Date Value Ref Range Status  02/19/2020 102 > OR = 60 mL/min/1.754mFinal          Failed - Valid encounter within last 6 months    Recent Outpatient Visits           6 months ago Type 2 diabetes mellitus with other specified complication, without long-term current use of insulin ([facility]  [place]   6 months ago Type 2 diabetes mellitus with other specified complication, without long-term current use of insulin ([facility]  [facility]aParks RangerAlDevonne DoughtyDO   1 year ago Annual physical exam   [facility] HauserDO   1 year ago Post  concussive syndrome   [facility]iderF[facility]   1 year ago Acute non-recurrent sinusitis, unspecified location   [facility]Lupita RaiderFNP       Future Appointments             In 1 week KaParks RangerAlDevonne DoughtyDOJ[facility]

## 2021-04-03 ENCOUNTER — Other Ambulatory Visit: Payer: Self-pay

## 2021-04-03 DIAGNOSIS — I10 Essential (primary) hypertension: Secondary | ICD-10-CM

## 2021-04-03 DIAGNOSIS — E785 Hyperlipidemia, unspecified: Secondary | ICD-10-CM

## 2021-04-03 DIAGNOSIS — E1169 Type 2 diabetes mellitus with other specified complication: Secondary | ICD-10-CM

## 2021-04-03 DIAGNOSIS — Z0000 Encounter for general adult medical examination without abnormal findings: Secondary | ICD-10-CM

## 2021-04-07 ENCOUNTER — Other Ambulatory Visit: Payer: BLUE CROSS/BLUE SHIELD

## 2021-04-08 ENCOUNTER — Telehealth (INDEPENDENT_AMBULATORY_CARE_PROVIDER_SITE_OTHER): Payer: BLUE CROSS/BLUE SHIELD | Admitting: Family Medicine

## 2021-04-08 ENCOUNTER — Encounter: Payer: Self-pay | Admitting: Family Medicine

## 2021-04-08 ENCOUNTER — Other Ambulatory Visit: Payer: Self-pay | Admitting: Family Medicine

## 2021-04-08 VITALS — Ht 63.0 in | Wt 218.0 lb

## 2021-04-08 DIAGNOSIS — R7989 Other specified abnormal findings of blood chemistry: Secondary | ICD-10-CM

## 2021-04-08 DIAGNOSIS — E1169 Type 2 diabetes mellitus with other specified complication: Secondary | ICD-10-CM

## 2021-04-08 DIAGNOSIS — F0781 Postconcussional syndrome: Secondary | ICD-10-CM | POA: Diagnosis not present

## 2021-04-08 DIAGNOSIS — E781 Pure hyperglyceridemia: Secondary | ICD-10-CM | POA: Diagnosis not present

## 2021-04-08 DIAGNOSIS — I10 Essential (primary) hypertension: Secondary | ICD-10-CM

## 2021-04-08 DIAGNOSIS — E785 Hyperlipidemia, unspecified: Secondary | ICD-10-CM

## 2021-04-08 LAB — COMPLETE METABOLIC PANEL WITH GFR
AG Ratio: 2.1 (calc) (ref 1.0–2.5)
ALT: 21 U/L (ref 6–29)
AST: 14 U/L (ref 10–35)
Albumin: 4.7 g/dL (ref 3.6–5.1)
Alkaline phosphatase (APISO): 76 U/L (ref 37–153)
BUN: 16 mg/dL (ref 7–25)
CO2: 22 mmol/L (ref 20–32)
Calcium: 9.4 mg/dL (ref 8.6–10.4)
Chloride: 106 mmol/L (ref 98–110)
Creat: 0.68 mg/dL (ref 0.50–1.03)
Globulin: 2.2 g/dL (calc) (ref 1.9–3.7)
Glucose, Bld: 146 mg/dL — ABNORMAL HIGH (ref 65–139)
Potassium: 3.9 mmol/L (ref 3.5–5.3)
Sodium: 140 mmol/L (ref 135–146)
Total Bilirubin: 0.3 mg/dL (ref 0.2–1.2)
Total Protein: 6.9 g/dL (ref 6.1–8.1)
eGFR: 103 mL/min/{1.73_m2} (ref >=60)

## 2021-04-08 LAB — LIPID PANEL
Cholesterol: 124 mg/dL (ref <200)
HDL: 36 mg/dL — ABNORMAL LOW (ref >=50)
LDL Cholesterol (Calc): 55 mg/dL (calc)
Non-HDL Cholesterol (Calc): 88 mg/dL (calc) (ref <130)
Total CHOL/HDL Ratio: 3.4 (calc) (ref <5.0)
Triglycerides: 318 mg/dL — ABNORMAL HIGH (ref <150)

## 2021-04-08 LAB — CBC WITH DIFFERENTIAL/PLATELET
Absolute Monocytes: 598 cells/uL (ref 200–950)
Basophils Absolute: 55 cells/uL (ref 0–200)
Basophils Relative: 0.6 %
Eosinophils Absolute: 110 cells/uL (ref 15–500)
Eosinophils Relative: 1.2 %
HCT: 43.4 % (ref 35.0–45.0)
Hemoglobin: 14.7 g/dL (ref 11.7–15.5)
Lymphs Abs: 3257 cells/uL (ref 850–3900)
MCH: 32.4 pg (ref 27.0–33.0)
MCHC: 33.9 g/dL (ref 32.0–36.0)
MCV: 95.6 fL (ref 80.0–100.0)
MPV: 10.1 fL (ref 7.5–12.5)
Monocytes Relative: 6.5 %
Neutro Abs: 5180 cells/uL (ref 1500–7800)
Neutrophils Relative %: 56.3 %
Platelets: 376 10*3/uL (ref 140–400)
RBC: 4.54 10*6/uL (ref 3.80–5.10)
RDW: 12.1 % (ref 11.0–15.0)
Total Lymphocyte: 35.4 %
WBC: 9.2 10*3/uL (ref 3.8–10.8)

## 2021-04-08 LAB — HEMOGLOBIN A1C
Hgb A1c MFr Bld: 6.9 % of total Hgb — ABNORMAL HIGH (ref <5.7)
Mean Plasma Glucose: 151 mg/dL
eAG (mmol/L): 8.4 mmol/L

## 2021-04-08 LAB — TSH: TSH: 4.77 mIU/L — ABNORMAL HIGH
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
## 2021-04-08 NOTE — Assessment & Plan Note (Signed)
Improved LDL to 55 on statin, TG >300 still likely due to familial genetic On Statin Allergic to Fish Oil - discussed option of PLANT Based Omega 3 - OTC can start this option now Continue atorvastatin 10mg  daily Follow-up lab in 6 months

## 2021-04-08 NOTE — Assessment & Plan Note (Addendum)
Stable chronic persistent problem Secondary memory loss and chronic pain

## 2021-04-08 NOTE — Assessment & Plan Note (Signed)
Wt down 20+ lbs with lifestyle diet management

## 2021-04-08 NOTE — Patient Instructions (Addendum)
Thank you for coming to the office today.  Try PLANT Based Omega 3 can help dry mouth too. Likely triglyceride is due to genetic component  Keep up the great work overall  Thyroid mild elevated TSH, we will repeat in 6 months  DUE for FASTING BLOOD WORK (no food or drink after midnight before the lab appointment, only water or coffee without cream/sugar on the morning of)  SCHEDULE "Lab Only" visit in the morning at the clinic for lab draw in 6 MONTHS   - Make sure Lab Only appointment is at about 1 week before your next appointment, so that results will be available  For Lab Results, once available within 2-3 days of blood draw, you can can log in to MyChart online to view your results and a brief explanation. Also, we can discuss results at next follow-up visit.   Please schedule a Follow-up Appointment to: No follow-ups on file.  If you have any other questions or concerns, please feel free to call the office or send a message through MyChart. You may also schedule an earlier appointment if necessary.  Additionally, you may be receiving a survey about your experience at our office within a few days to 1 week by e-mail or mail. We value your feedback.  Saralyn Pilar, DO [facility], [place]

## 2021-04-08 NOTE — Assessment & Plan Note (Signed)
Dramatic improved A1c down to 6.9 prior >11 due to poor diet, now major overhaul and wt loss Complications - hyperlipidemia, obesity - increases risk of future cardiovascular complications  Blurry vision on Rybelsus, discontinued due to side effect  Plan:  1. Remain off Rybelsus - She has needle phobia we will not use inj GLP1 or other med option -  Continue current therapy - Metformin 1000mg  AM / 500mg  PM 2. Encourage improved lifestyle - low carb, low sugar diet, reduce portion size, continue improving regular exercise 3. Continue ACEi, Statin  May need CCM Pharmacy for med assist

## 2021-09-11 ENCOUNTER — Other Ambulatory Visit: Payer: BLUE CROSS/BLUE SHIELD

## 2021-09-11 ENCOUNTER — Encounter: Payer: Self-pay | Admitting: Family Medicine

## 2021-09-11 DIAGNOSIS — E1169 Type 2 diabetes mellitus with other specified complication: Secondary | ICD-10-CM

## 2021-09-11 DIAGNOSIS — I10 Essential (primary) hypertension: Secondary | ICD-10-CM

## 2021-09-11 DIAGNOSIS — R7989 Other specified abnormal findings of blood chemistry: Secondary | ICD-10-CM

## 2021-09-16 ENCOUNTER — Telehealth: Payer: BLUE CROSS/BLUE SHIELD | Admitting: Family Medicine

## 2021-09-25 LAB — COMPLETE METABOLIC PANEL WITH GFR
AG Ratio: 2.2 (calc) (ref 1.0–2.5)
ALT: 28 U/L (ref 6–29)
AST: 15 U/L (ref 10–35)
Albumin: 4.3 g/dL (ref 3.6–5.1)
Alkaline phosphatase (APISO): 65 U/L (ref 37–153)
BUN: 12 mg/dL (ref 7–25)
CO2: 25 mmol/L (ref 20–32)
Calcium: 9.1 mg/dL (ref 8.6–10.4)
Chloride: 104 mmol/L (ref 98–110)
Creat: 0.75 mg/dL (ref 0.50–1.03)
Globulin: 2 g/dL (calc) (ref 1.9–3.7)
Glucose, Bld: 200 mg/dL — ABNORMAL HIGH (ref 65–139)
Potassium: 4.3 mmol/L (ref 3.5–5.3)
Sodium: 139 mmol/L (ref 135–146)
Total Bilirubin: 0.5 mg/dL (ref 0.2–1.2)
Total Protein: 6.3 g/dL (ref 6.1–8.1)
eGFR: 95 mL/min/{1.73_m2} (ref >=60)

## 2021-09-25 LAB — HEMOGLOBIN A1C
Hgb A1c MFr Bld: 7 % of total Hgb — ABNORMAL HIGH (ref <5.7)
Mean Plasma Glucose: 154 mg/dL
eAG (mmol/L): 8.5 mmol/L

## 2021-09-25 LAB — TSH: TSH: 4.1 mIU/L

## 2021-09-25 LAB — LIPID PANEL
Cholesterol: 146 mg/dL (ref <200)
HDL: 44 mg/dL — ABNORMAL LOW (ref >=50)
LDL Cholesterol (Calc): 67 mg/dL (calc)
Non-HDL Cholesterol (Calc): 102 mg/dL (calc) (ref <130)
Total CHOL/HDL Ratio: 3.3 (calc) (ref <5.0)
Triglycerides: 265 mg/dL — ABNORMAL HIGH (ref <150)

## 2021-09-25 LAB — T4, FREE: Free T4: 1 ng/dL (ref 0.8–1.8)

## 2021-09-25 NOTE — Telephone Encounter (Signed)
See below regarding patient complaint

## 2021-09-29 ENCOUNTER — Encounter: Payer: Self-pay | Admitting: Family Medicine

## 2021-09-29 ENCOUNTER — Other Ambulatory Visit (INDEPENDENT_AMBULATORY_CARE_PROVIDER_SITE_OTHER): Payer: Self-pay | Admitting: Family Medicine

## 2021-09-29 ENCOUNTER — Ambulatory Visit (INDEPENDENT_AMBULATORY_CARE_PROVIDER_SITE_OTHER): Payer: Self-pay | Admitting: Family Medicine

## 2021-09-29 VITALS — Wt 218.0 lb

## 2021-09-29 DIAGNOSIS — I10 Essential (primary) hypertension: Secondary | ICD-10-CM

## 2021-09-29 DIAGNOSIS — E1169 Type 2 diabetes mellitus with other specified complication: Secondary | ICD-10-CM

## 2021-09-29 DIAGNOSIS — F0781 Postconcussional syndrome: Secondary | ICD-10-CM

## 2021-09-29 DIAGNOSIS — E785 Hyperlipidemia, unspecified: Secondary | ICD-10-CM

## 2021-09-29 MED ORDER — LISINOPRIL-HYDROCHLOROTHIAZIDE 20-12.5 MG PO TABS: 1.0000 | ORAL_TABLET | Freq: Every day | ORAL | 3 refills | Status: DC

## 2021-09-29 MED ORDER — METFORMIN HCL 500 MG PO TABS: 500.0000 mg | ORAL_TABLET | Freq: Every day | ORAL | 3 refills | Status: DC

## 2021-09-29 MED ORDER — ATORVASTATIN CALCIUM 10 MG PO TABS: 10.0000 mg | ORAL_TABLET | Freq: Every day | ORAL | 3 refills | Status: DC

## 2021-09-29 MED ORDER — METFORMIN HCL 1000 MG PO TABS: 1000.0000 mg | ORAL_TABLET | Freq: Every day | ORAL | 3 refills | Status: DC

## 2021-09-29 NOTE — Progress Notes (Signed)
Virtual Visit via Telephone The purpose of this virtual visit is to provide medical care while limiting exposure to the novel coronavirus (COVID19) for both patient and office staff.  Consent was obtained for phone visit:  Yes.   Answered questions that patient had about telehealth interaction:  Yes.   I discussed the limitations, risks, security and privacy concerns of performing an evaluation and management service by telephone. I also discussed with the patient that there may be a patient responsible charge related to this service. The patient expressed understanding and agreed to proceed.  Patient Location: Home Provider Location: [facility] (Office)  Participants in virtual visit: - Patient: Barbara Bird - CMA: Orinda Kenner, [place] - Provider: Dr Parks Ranger  ---------------------------------------------------------------------- Chief Complaint  Patient presents with   Hyperlipidemia   Diabetes    S: Reviewed CMA documentation. I have called patient and gathered additional HPI as follows:  She admits not fasting fully, she had diet soda for recent lab.  Type 2 Diabetes Last A1c 7.0, prior range 6-7 Has done well improved diet. Continues on metformin 1061m and 5027mdaily for total 1500, takes one in AM and one in PM Tolerating med well Does not have podiatrist Not endorsing numbness tingling in feet Has had eye exams for blurred vision, she will check on diabetes eye exam  [facility] She admits increased wt gain on Lyrica 17 lbs since last visit. She has tried losing weight and was eating Salads regularly.   Hyperlipidemia HyperTG Improved, likely component of genetic factor in past has had 200-400 range She has improved on this now Continues on Atorvastatin with good results for LDL cholesterol  She is waiting on disability / medicaid coverage, now 1.5 years application. She is uninsured currently prefers to avoid other  testing screening.  Denies any fevers, chills, sweats, body ache, cough, shortness of breath, sinus pain or pressure, headache, abdominal pain, diarrhea  Past Medical History:  Diagnosis Date   Hypertension    Social History   Tobacco Use   Smoking status: Never   Smokeless tobacco: Never  Substance Use Topics   Alcohol use: Yes    Comment: occasional   Drug use: No    Current Outpatient Medications:    clonazePAM (KLONOPIN) 0.5 MG tablet, Take by mouth., Disp: , Rfl:    diclofenac Sodium (VOLTAREN) 1 % GEL, Apply 2 g topically 4 (four) times daily., Disp: 100 g, Rfl: 2   lidocaine (LIDODERM) 5 %, Place onto the skin., Disp: , Rfl:    Menthol, Topical Analgesic, 10 % LIQD, , Disp: , Rfl:    mometasone (NASONEX) 50 MCG/ACT nasal spray, as needed., Disp: , Rfl: 0   Nerve Stimulator (STANDARD TENS) DEVI, by Does not apply route., Disp: , Rfl:    nortriptyline (PAMELOR) 10 MG capsule, Take 10 mg by mouth 2 (two) times daily., Disp: , Rfl:    tiZANidine (ZANAFLEX) 2 MG tablet, Take 0.5-1 tablets (1-2 mg total) by mouth every 6 (six) hours as needed for muscle spasms., Disp: 30 tablet, Rfl: 2   valACYclovir (VALTREX) 1000 MG tablet, Take 1 tablet (1,000 mg total) by mouth 2 (two) times daily. For 5-10 days as needed for HSV flare, Disp: 10 tablet, Rfl: 2   atorvastatin (LIPITOR) 10 MG tablet, Take 1 tablet (10 mg total) by mouth daily., Disp: 90 tablet, Rfl: 3   lisinopril-hydrochlorothiazide (ZESTORETIC) 20-12.5 MG tablet, Take 1 tablet by mouth daily., Disp: 90 tablet, Rfl: 3  metFORMIN (GLUCOPHAGE) 1000 MG tablet, Take 1 tablet (1,000 mg total) by mouth daily with breakfast. Note taking 547m in PM with supper., Disp: 90 tablet, Rfl: 3   metFORMIN (GLUCOPHAGE) 500 MG tablet, Take 1 tablet (500 mg total) by mouth daily with supper. Note also taking 10078min morning with breakfast, Disp: 90 tablet, Rfl: 3     04/08/2021   10:49 AM 04/08/2021   10:48 AM 02/26/2020    9:49 AM   Depression screen PHQ 2/9  Decreased Interest  1 0  Down, Depressed, Hopeless  1 0  PHQ - 2 Score  2 0  Altered sleeping  1   Tired, decreased energy 1    Change in appetite 2    Feeling bad or failure about yourself  2    Trouble concentrating 2    Moving slowly or fidgety/restless 0    Suicidal thoughts 0    PHQ-9 Score  3        04/08/2021   10:50 AM  GAD 7 : Generalized Anxiety Score  Nervous, Anxious, on Edge 3  Control/stop worrying 3  Worry too much - different things 3  Trouble relaxing 3  Restless 2  Easily annoyed or irritable 2  Afraid - awful might happen 3  Total GAD 7 Score 19    -------------------------------------------------------------------------- O: No physical exam performed due to remote telephone encounter.  Lab results reviewed.  Recent Results (from the past 2160 hour(s))  T4, free     Status: None   Collection Time: 09/24/21  7:59 AM  Result Value Ref Range   Free T4 1.0 0.8 - 1.8 ng/dL  TSH     Status: None   Collection Time: 09/24/21  7:59 AM  Result Value Ref Range   TSH 4.10 mIU/L    Comment:           Reference Range .           > or = 20 Years  0.40-4.50 .                Pregnancy Ranges           First trimester    0.26-2.66           Second trimester   0.55-2.73           Third trimester    0.43-2.91   Hemoglobin A1c     Status: Abnormal   Collection Time: 09/24/21  7:59 AM  Result Value Ref Range   Hgb A1c MFr Bld 7.0 (H) <5.7 % of total Hgb    Comment: For someone without known diabetes, a hemoglobin A1c value of 6.5% or greater indicates that they may have  diabetes and this should be confirmed with a follow-up  test. . For someone with known diabetes, a value <7% indicates  that their diabetes is well controlled and a value  greater than or equal to 7% indicates suboptimal  control. A1c targets should be individualized based on  duration of diabetes, age, comorbid conditions, and  other  considerations. . Currently, no consensus exists regarding use of hemoglobin A1c for diagnosis of diabetes for children. .    Mean Plasma Glucose 154 mg/dL   eAG (mmol/L) 8.5 mmol/L  Lipid panel     Status: Abnormal   Collection Time: 09/24/21  7:59 AM  Result Value Ref Range   Cholesterol 146 <200 mg/dL   HDL 44 (L) > OR = 50 mg/dL   Triglycerides 265 (  H) <150 mg/dL    Comment: . If a non-fasting specimen was collected, consider repeat triglyceride testing on a fasting specimen if clinically indicated.  Yates Decamp et al. J. of Clin. Lipidol. 1287;8:676-720. Marland Kitchen    LDL Cholesterol (Calc) 67 mg/dL (calc)    Comment: Reference range: <100 . Desirable range <100 mg/dL for primary prevention;   <70 mg/dL for patients with CHD or diabetic patients  with > or = 2 CHD risk factors. Marland Kitchen LDL-C is now calculated using the Martin-Hopkins  calculation, which is a validated novel method providing  better accuracy than the Friedewald equation in the  estimation of LDL-C.  Cresenciano Genre et al. Annamaria Helling. 9470;962(83): 2061-2068  (http://education.[facility].com/faq/FAQ164)    Total CHOL/HDL Ratio 3.3 <5.0 (calc)   Non-HDL Cholesterol (Calc) 102 <130 mg/dL (calc)    Comment: For patients with diabetes plus 1 major ASCVD risk  factor, treating to a non-HDL-C goal of <100 mg/dL  (LDL-C of <70 mg/dL) is considered a therapeutic  option.   COMPLETE METABOLIC PANEL WITH GFR     Status: Abnormal   Collection Time: 09/24/21  7:59 AM  Result Value Ref Range   Glucose, Bld 200 (H) 65 - 139 mg/dL    Comment: .        Non-fasting reference interval .    BUN 12 7 - 25 mg/dL   Creat 0.75 0.50 - 1.03 mg/dL   eGFR 95 > OR = 60 mL/min/1.7m    Comment: The eGFR is based on the CKD-EPI 2021 equation. To calculate  the new eGFR from a previous Creatinine or Cystatin C result, go to https://www.kidney.org/professionals/ kdoqi/gfr%5Fcalculator    BUN/Creatinine Ratio NOT APPLICABLE 6 - 22 (calc)    Sodium 139 135 - 146 mmol/L   Potassium 4.3 3.5 - 5.3 mmol/L   Chloride 104 98 - 110 mmol/L   CO2 25 20 - 32 mmol/L   Calcium 9.1 8.6 - 10.4 mg/dL   Total Protein 6.3 6.1 - 8.1 g/dL   Albumin 4.3 3.6 - 5.1 g/dL   Globulin 2.0 1.9 - 3.7 g/dL (calc)   AG Ratio 2.2 1.0 - 2.5 (calc)   Total Bilirubin 0.5 0.2 - 1.2 mg/dL   Alkaline phosphatase (APISO) 65 37 - 153 U/L   AST 15 10 - 35 U/L   ALT 28 6 - 29 U/L    -------------------------------------------------------------------------- A&P:  Problem List Items Addressed This Visit     Type 2 diabetes mellitus with other specified complication (HCC) - Primary   Post concussive syndrome   Morbid obesity ([place]   Hyperlipidemia associated with type 2 diabetes mellitus ([place]   Improving weight, then has gained w/ Lyrica. Continues diet improvement DM A1 controlled 7.0, continues on Metformin 1500 daily management, 1000 AM and 500 PM Hyperlipidemia controlled on STatin therapy Atorvastatin. Still higher TG but improved 200 range now, likely familial or genetic component, future prefer non fasting lab avoid diet soda.  Post Concussive Syndrome Memory Loss [facility]. Now needs to get coverage to return to therapy, waiting on disability.  No orders of the defined types were placed in this encounter.   Follow-up: - Return in 1 year fasting labs.  Patient verbalizes understanding with the above medical recommendations including the limitation of remote medical advice.  Specific follow-up and call-back criteria were given for patient to follow-up or seek medical care more urgently if needed.   - Time spent in direct consultation with patient on phone:  9 minutes   Nobie Putnam, DO [facility] 09/29/2021, 11:40 AM

## 2022-03-31 ENCOUNTER — Other Ambulatory Visit: Payer: Self-pay | Admitting: Family Medicine

## 2022-03-31 DIAGNOSIS — E1169 Type 2 diabetes mellitus with other specified complication: Secondary | ICD-10-CM

## 2022-03-31 DIAGNOSIS — I10 Essential (primary) hypertension: Secondary | ICD-10-CM

## 2022-03-31 NOTE — Telephone Encounter (Signed)
Called patient to schedule appt for medication refills per protocol. Last seen 6 months ago. Patient reports PCP has allowed Rx refills for 1 year. No appt scheduled at this time

## 2022-03-31 NOTE — Telephone Encounter (Signed)
Requested medication (s) are due for refill today: yes   Requested medication (s) are on the active medication list: yes   Last refill:  09/29/21 #90 3 refills  Future visit scheduled: no   Notes to clinic:  called patient to schedule appt per protocol. Last OV 09/29/21. Per OV note, PCP f/u to return in 1 year for fasting labs. Patient reports she does not have to return for OV for 1 year. Do you want to refill Rxs?     Requested Prescriptions  Pending Prescriptions Disp Refills   metFORMIN (GLUCOPHAGE) 1000 MG tablet [Pharmacy Med Name: metFORMIN HCL 1,000 MG TABLET] 90 tablet 3    Sig: TAKE ONE TABLET BY MOUTH DAILY WITH BREAKFAST. TAKE WITH OTHER PILL 500MG PER DAY.     Endocrinology:  Diabetes - Biguanides Failed - 03/31/2022  6:21 AM      Failed - HBA1C is between 0 and 7.9 and within 180 days    Hgb A1c MFr Bld  Date Value Ref Range Status  09/24/2021 7.0 (H) <5.7 % of total Hgb Final    Comment:    For someone without known diabetes, a hemoglobin A1c value of 6.5% or greater indicates that they may have  diabetes and this should be confirmed with a follow-up  test. . For someone with known diabetes, a value <7% indicates  that their diabetes is well controlled and a value  greater than or equal to 7% indicates suboptimal  control. A1c targets should be individualized based on  duration of diabetes, age, comorbid conditions, and  other considerations. . Currently, no consensus exists regarding use of hemoglobin A1c for diagnosis of diabetes for children. .          Failed - B12 Level in normal range and within 720 days    No results found for: "VITAMINB12"       Failed - Valid encounter within last 6 months    Recent Outpatient Visits           6 months ago Type 2 diabetes mellitus with other specified complication, without long-term current use of insulin [facility])   [facility], Devonne Doughty, DO   11 months ago Type 2 diabetes mellitus  with other specified complication, without long-term current use of insulin ([place])   [facility] [place], Devonne Doughty, DO   1 year ago Type 2 diabetes mellitus with other specified complication, without long-term current use of insulin ([place])   [facility], Devonne Doughty, DO   1 year ago Type 2 diabetes mellitus with other specified complication, without long-term current use of insulin ([place])   [facility] Olin Hauser, DO   2 years ago Annual physical exam   [facility], Devonne Doughty, DO              Passed - Cr in normal range and within 360 days    Creat  Date Value Ref Range Status  09/24/2021 0.75 0.50 - 1.03 mg/dL Final         Passed - eGFR in normal range and within 360 days    GFR, Est African American  Date Value Ref Range Status  02/19/2020 119 > OR = 60 mL/min/1.69m Final   GFR, Est Non African American  Date Value Ref Range Status  02/19/2020 102 > OR = 60 mL/min/1.759mFinal   eGFR  Date Value Ref Range Status  09/24/2021 95 >  OR = 60 mL/min/1.73m2 Final    Comment:    The eGFR is based on the CKD-EPI 2021 equation. To calculate  the new eGFR from a previous Creatinine or Cystatin C result, go to https://www.kidney.org/professionals/ kdoqi/gfr%5Fcalculator          Passed - CBC within normal limits and completed in the last 12 months    WBC  Date Value Ref Range Status  04/07/2021 9.2 3.8 - 10.8 Thousand/uL Final   RBC  Date Value Ref Range Status  04/07/2021 4.54 3.80 - 5.10 Million/uL Final   Hemoglobin  Date Value Ref Range Status  04/07/2021 14.7 11.7 - 15.5 g/dL Final  11/08/2014 14.1 11.1 - 15.9 g/dL Final   HCT  Date Value Ref Range Status  04/07/2021 43.4 35.0 - 45.0 % Final   Hematocrit  Date Value Ref Range Status  11/08/2014 40.5 34.0 - 46.6 % Final   MCHC  Date Value Ref Range Status  04/07/2021 33.9 32.0 - 36.0 g/dL Final    MCH  Date Value Ref Range Status  04/07/2021 32.4 27.0 - 33.0 pg Final   MCV  Date Value Ref Range Status  04/07/2021 95.6 80.0 - 100.0 fL Final  11/08/2014 90 79 - 97 fL Final   No results found for: "PLTCOUNTKUC", "LABPLAT", "POCPLA" RDW  Date Value Ref Range Status  04/07/2021 12.1 11.0 - 15.0 % Final  11/08/2014 13.8 12.3 - 15.4 % Final          atorvastatin (LIPITOR) 10 MG tablet [Pharmacy Med Name: ATORVASTATIN 10 MG TABLET] 90 tablet 3    Sig: TAKE ONE TABLET BY MOUTH DAILY     Cardiovascular:  Antilipid - Statins Failed - 03/31/2022  6:21 AM      Failed - Lipid Panel in normal range within the last 12 months    Cholesterol, Total  Date Value Ref Range Status  04/02/2015 97 (L) 100 - 199 mg/dL Final   Cholesterol  Date Value Ref Range Status  09/24/2021 146 <200 mg/dL Final   LDL Cholesterol (Calc)  Date Value Ref Range Status  09/24/2021 67 mg/dL (calc) Final    Comment:    Reference range: <100 . Desirable range <100 mg/dL for primary prevention;   <70 mg/dL for patients with CHD or diabetic patients  with > or = 2 CHD risk factors. . LDL-C is now calculated using the Martin-Hopkins  calculation, which is a validated novel method providing  better accuracy than the Friedewald equation in the  estimation of LDL-C.  Martin SS et al. JAMA. 2013;310(19): 2061-2068  (http://education.[facility].com/faq/FAQ164)    HDL  Date Value Ref Range Status  09/24/2021 44 (L) > OR = 50 mg/dL Final  04/02/2015 32 (L) >39 mg/dL Final   Triglycerides  Date Value Ref Range Status  09/24/2021 265 (H) <150 mg/dL Final    Comment:    . If a non-fasting specimen was collected, consider repeat triglyceride testing on a fasting specimen if clinically indicated.  Jacobson et al. J. of Clin. Lipidol. 2015;9:129-169. .          Passed - Patient is not pregnant      Passed - Valid encounter within last 12 months    Recent Outpatient Visits           6  months ago Type 2 diabetes mellitus with other specified complication, without long-term current use of insulin (HCC)   [facility] Karamalegos, Alexander J, DO   11 months ago Type   2 diabetes mellitus with other specified complication, without long-term current use of insulin (HCC)   [facility] Karamalegos, Alexander J, DO   1 year ago Type 2 diabetes mellitus with other specified complication, without long-term current use of insulin (HCC)   [facility] Karamalegos, Alexander J, DO   1 year ago Type 2 diabetes mellitus with other specified complication, without long-term current use of insulin (HCC)   [facility] Karamalegos, Alexander J, DO   2 years ago Annual physical exam   [facility] Karamalegos, Alexander J, DO               lisinopril-hydrochlorothiazide (ZESTORETIC) 20-12.5 MG tablet [Pharmacy Med Name: LISINOPRIL-HCTZ 20-12.5 MG TAB] 90 tablet 3    Sig: TAKE ONE TABLET BY MOUTH DAILY     Cardiovascular:  ACEI + Diuretic Combos Failed - 03/31/2022  6:21 AM      Failed - Na in normal range and within 180 days    Sodium  Date Value Ref Range Status  09/24/2021 139 135 - 146 mmol/L Final  07/22/2015 140 134 - 144 mmol/L Final         Failed - K in normal range and within 180 days    Potassium  Date Value Ref Range Status  09/24/2021 4.3 3.5 - 5.3 mmol/L Final         Failed - Cr in normal range and within 180 days    Creat  Date Value Ref Range Status  09/24/2021 0.75 0.50 - 1.03 mg/dL Final         Failed - eGFR is 30 or above and within 180 days    GFR, Est African American  Date Value Ref Range Status  02/19/2020 119 > OR = 60 mL/min/1.73m2 Final   GFR, Est Non African American  Date Value Ref Range Status  02/19/2020 102 > OR = 60 mL/min/1.73m2 Final   eGFR  Date Value Ref Range Status  09/24/2021 95 > OR = 60 mL/min/1.73m2 Final    Comment:    The eGFR is based on the  CKD-EPI 2021 equation. To calculate  the new eGFR from a previous Creatinine or Cystatin C result, go to https://www.kidney.org/professionals/ kdoqi/gfr%5Fcalculator          Failed - Valid encounter within last 6 months    Recent Outpatient Visits           6 months ago Type 2 diabetes mellitus with other specified complication, without long-term current use of insulin (HCC)   [facility] Karamalegos, Alexander J, DO   11 months ago Type 2 diabetes mellitus with other specified complication, without long-term current use of insulin (HCC)   [facility] Karamalegos, Alexander J, DO   1 year ago Type 2 diabetes mellitus with other specified complication, without long-term current use of insulin (HCC)   [facility] Karamalegos, Alexander J, DO   1 year ago Type 2 diabetes mellitus with other specified complication, without long-term current use of insulin (HCC)   [facility] Karamalegos, Alexander J, DO   2 years ago Annual physical exam   [facility] Karamalegos, Alexander J, DO              Passed - Patient is not pregnant      Passed - Last BP in normal range    BP Readings from Last 1 Encounters:  02/26/20 118/66          

## 2022-05-27 ENCOUNTER — Emergency Department
Admission: EM | Admit: 2022-05-27 | Discharge: 2022-05-27 | Disposition: A | Discharge status: Home / Self Care | Payer: Medicaid Other | Attending: Emergency Medicine | Admitting: Emergency Medicine

## 2022-05-27 ENCOUNTER — Encounter: Payer: Self-pay | Admitting: Emergency Medicine

## 2022-05-27 ENCOUNTER — Emergency Department: Payer: Medicaid Other

## 2022-05-27 ENCOUNTER — Other Ambulatory Visit: Payer: Self-pay

## 2022-05-27 DIAGNOSIS — D72829 Elevated white blood cell count, unspecified: Secondary | ICD-10-CM | POA: Insufficient documentation

## 2022-05-27 DIAGNOSIS — R202 Paresthesia of skin: Secondary | ICD-10-CM | POA: Diagnosis not present

## 2022-05-27 DIAGNOSIS — I10 Essential (primary) hypertension: Secondary | ICD-10-CM | POA: Insufficient documentation

## 2022-05-27 DIAGNOSIS — Z7984 Long term (current) use of oral hypoglycemic drugs: Secondary | ICD-10-CM | POA: Diagnosis not present

## 2022-05-27 DIAGNOSIS — Z79899 Other long term (current) drug therapy: Secondary | ICD-10-CM | POA: Insufficient documentation

## 2022-05-27 DIAGNOSIS — E119 Type 2 diabetes mellitus without complications: Secondary | ICD-10-CM | POA: Insufficient documentation

## 2022-05-27 HISTORY — DX: Type 2 diabetes mellitus without complications: E11.9

## 2022-05-27 HISTORY — DX: Pure hypercholesterolemia, unspecified: E78.00

## 2022-05-27 LAB — CBC
HCT: 41.3 % (ref 36.0–46.0)
Hemoglobin: 14.4 g/dL (ref 12.0–15.0)
MCH: 31.3 pg (ref 26.0–34.0)
MCHC: 34.9 g/dL (ref 30.0–36.0)
MCV: 89.8 fL (ref 80.0–100.0)
Platelets: 299 10*3/uL (ref 150–400)
RBC: 4.6 MIL/uL (ref 3.87–5.11)
RDW: 11.7 % (ref 11.5–15.5)
WBC: 10.8 10*3/uL — ABNORMAL HIGH (ref 4.0–10.5)
nRBC: 0 % (ref 0.0–0.2)

## 2022-05-27 LAB — BASIC METABOLIC PANEL
Anion gap: 10 (ref 5–15)
BUN: 17 mg/dL (ref 6–20)
CO2: 19 mmol/L — ABNORMAL LOW (ref 22–32)
Calcium: 8.8 mg/dL — ABNORMAL LOW (ref 8.9–10.3)
Chloride: 103 mmol/L (ref 98–111)
Creatinine, Ser: 0.63 mg/dL (ref 0.44–1.00)
GFR, Estimated: >60 mL/min (ref >60)
Glucose, Bld: 241 mg/dL — ABNORMAL HIGH (ref 70–99)
Potassium: 3.8 mmol/L (ref 3.5–5.1)
Sodium: 132 mmol/L — ABNORMAL LOW (ref 135–145)

## 2022-05-27 LAB — TROPONIN I (HIGH SENSITIVITY): Troponin I (High Sensitivity): 3 ng/L (ref <18)

## 2022-05-27 NOTE — ED Provider Notes (Signed)
[facility] Provider Note    Event Date/Time   First MD Initiated Contact with Patient 05/27/22 [phone]     (approximate)   History   Arm Pain   HPI  Barbara Bird is a 57 y.o. female with history of hypertension, diabetes, hyperlipidemia, TBI who presents to the emergency department with left arm tingling that started at 7 PM.  No headache, numbness, weakness, chest pain, shortness of breath.  States she has had intermittent tingling before and associates this with her previous TBI but states it is never lasted this long.  No neck pain.   History provided by patient.    Past Medical History:  Diagnosis Date   Diabetes mellitus without complication ([place])    Hypercholesteremia    Hypertension     Past Surgical History:  Procedure Laterality Date   GALLBLADDER SURGERY     TUBAL LIGATION      MEDICATIONS:  Prior to Admission medications   Medication Sig Start Date End Date Taking? Authorizing Provider  atorvastatin (LIPITOR) 10 MG tablet TAKE ONE TABLET BY MOUTH DAILY 03/31/22   Parks Ranger, Devonne Doughty, DO  clonazePAM Bobbye [place]) 0.5 MG tablet Take by mouth. 06/06/20   [provider]  diclofenac Sodium (VOLTAREN) 1 % GEL Apply 2 g topically 4 (four) times daily. 02/26/20   Karamalegos, Devonne Doughty, DO  lidocaine (LIDODERM) 5 % Place onto the skin.    [provider]  lisinopril-hydrochlorothiazide (ZESTORETIC) 20-12.5 MG tablet TAKE ONE TABLET BY MOUTH DAILY 03/31/22   Parks Ranger, Devonne Doughty, DO  Menthol, Topical Analgesic, 10 % LIQD     [provider]  metFORMIN (GLUCOPHAGE) 1000 MG tablet TAKE ONE TABLET BY MOUTH DAILY WITH BREAKFAST. TAKE WITH OTHER PILL 500MG PER DAY. 03/31/22   Karamalegos, Devonne Doughty, DO  metFORMIN (GLUCOPHAGE) 500 MG tablet Take 1 tablet (500 mg total) by mouth daily with supper. Note also taking 1047m in morning with breakfast 09/29/21   Karamalegos, Alexander J, DO  mometasone (NASONEX) 50  MCG/ACT nasal spray as needed. 08/14/16   [provider]  Nerve Stimulator (STANDARD TENS) DEVI by Does not apply route.    [provider]  nortriptyline (PAMELOR) 10 MG capsule Take 10 mg by mouth 2 (two) times daily. 01/07/20   [provider]  tiZANidine (ZANAFLEX) 2 MG tablet Take 0.5-1 tablets (1-2 mg total) by mouth every 6 (six) hours as needed for muscle spasms. 02/26/20   Karamalegos, ADevonne Doughty DO  valACYclovir (VALTREX) 1000 MG tablet Take 1 tablet (1,000 mg total) by mouth 2 (two) times daily. For 5-10 days as needed for HSV flare 07/23/20   KOlin Hauser DO    Physical Exam   Triage Vital Signs: ED Triage Vitals  Enc Vitals Group     BP 05/27/22 0301 (!) 150/85     Pulse Rate 05/27/22 0301 89     Resp 05/27/22 0301 18     Temp 05/27/22 0301 98 F (36.7 C)     Temp Source 05/27/22 0301 Oral     SpO2 05/27/22 0301 97 %     Weight 05/27/22 0258 242 lb (109.8 kg)     Height 05/27/22 0258 5' 4"$  (1.626 m)     Head Circumference --      Peak Flow --      Pain Score 05/27/22 0258 0     Pain Loc --      Pain Edu? --      Excl. in [place] --  Most recent vital signs: Vitals:   05/27/22 0301  BP: (!) 150/85  Pulse: 89  Resp: 18  Temp: 98 F (36.7 C)  SpO2: 97%    CONSTITUTIONAL: Alert, responds appropriately to questions. Well-appearing; well-nourished HEAD: Normocephalic, atraumatic EYES: Conjunctivae clear, pupils appear equal, sclera nonicteric ENT: normal nose; moist mucous membranes NECK: Supple, normal ROM CARD: RRR; S1 and S2 appreciated RESP: Normal chest excursion without splinting or tachypnea; breath sounds clear and equal bilaterally; no wheezes, no rhonchi, no rales, no hypoxia or respiratory distress, speaking full sentences ABD/GI: Non-distended; soft, non-tender, no rebound, no guarding, no peritoneal signs BACK: The back appears normal EXT: Normal ROM in all joints; no deformity noted, no edema, compartments in  the left arm are soft, left arm is warm and well-perfused SKIN: Normal color for age and race; warm; no rash on exposed skin NEURO: Moves all extremities equally, normal speech, reports normal sensation diffusely, no hyperreflexia or clonus, no drift, cranial nerves II through XII intact PSYCH: The patient's mood and manner are appropriate.   ED Results / Procedures / Treatments   LABS: (all labs ordered are listed, but only abnormal results are displayed) Labs Reviewed  CBC - Abnormal; Notable for the following components:      Result Value   WBC 10.8 (*)    All other components within normal limits  BASIC METABOLIC PANEL - Abnormal; Notable for the following components:   Sodium 132 (*)    CO2 19 (*)    Glucose, Bld 241 (*)    Calcium 8.8 (*)    All other components within normal limits  TROPONIN I (HIGH SENSITIVITY)     EKG:  EKG Interpretation  Date/Time:  Thursday May 27 2022 03:08:32 EST Ventricular Rate:  85 PR Interval:  142 QRS Duration: 80 QT Interval:  368 QTC Calculation: 437 R Axis:   107 Text Interpretation: Normal sinus rhythm Rightward axis Cannot rule out Anterior infarct , age undetermined Abnormal ECG No previous ECGs available Confirmed by Pryor Curia [phone]) on 05/27/2022 4:20:30 AM         RADIOLOGY: My personal review and interpretation of imaging: CT head unremarkable.  I have personally reviewed all radiology reports.   CT HEAD WO CONTRAST (5MM)  Result Date: 05/27/2022 CLINICAL DATA:  Tingling in the left arm for 7 hours EXAM: CT HEAD WITHOUT CONTRAST TECHNIQUE: Contiguous axial images were obtained from the base of the skull through the vertex without intravenous contrast. RADIATION DOSE REDUCTION: This exam was performed according to the departmental dose-optimization program which includes automated exposure control, adjustment of the mA and/or kV according to patient size and/or use of iterative reconstruction technique. COMPARISON:   None Available. FINDINGS: Brain: No evidence of acute infarction, hemorrhage, hydrocephalus, extra-axial collection or mass lesion/mass effect. Vascular: No hyperdense vessel or unexpected calcification. Skull: Normal. Negative for fracture or focal lesion. Sinuses/Orbits: No acute finding. IMPRESSION: Negative head CT. Electronically Signed   By: Jorje Guild M.D.   On: 05/27/2022 05:05     PROCEDURES:  Critical Care performed: No      Procedures    IMPRESSION / MDM / ASSESSMENT AND PLAN / ED COURSE  I reviewed the triage vital signs and the nursing notes.    Patient here with complaints of tingling in the left arm.  Denies numbness, weakness, headache, neck pain.     DIFFERENTIAL DIAGNOSIS (includes but not limited to):   Paresthesias, radiculopathy, CVA, intracranial hemorrhage, mass, electrolyte derangement, seems atypical for her  anginal equivalent   Patient's presentation is most consistent with acute presentation with potential threat to life or bodily function.   PLAN: EKG nonischemic.  Labs obtained from triage.  Patient has slight leukocytosis but no fever or infectious symptoms.  Glucose slightly elevated at 241 with bicarb of 19 but normal anion gap.  First troponin negative.  Patient initially reluctant for head CT but after further discussion agrees to proceed.  Currently NIH stroke scale is 0.  She does have tingling but no numbness in the arm or reports normal sensation when testing bilaterally.   MEDICATIONS GIVEN IN ED: Medications - No data to display   ED COURSE: CT head reviewed and interpreted by myself and the radiologist and shows no acute abnormality.  Discussed with patient that low suspicion for stroke due to NIH stroke scale of 0.  We discussed obtaining MRI but patient states she is extremely claustrophobic and I feel with only tingling in the arm and no numbness or other focal neurologic deficits that the benefit is low and have recommended  outpatient neurologic follow-up which she is comfortable with.  Some of her symptoms seem atypical as she states sometimes it involves the entire left arm but then only parts of the forearm and then only parts of the upper arm at times but does not follow any dermatomal pattern.  Low suspicion that this is coming from her neck.  Low suspicion for severe spinal stenosis, epidural abscess or hematoma, discitis or osteomyelitis, transverse myelitis, Guillain-Barr.   Given symptoms ongoing for 9 hours, I do not feel she needs a repeat troponin especially given no chest pain, chest discomfort or shortness of breath.   Will discharge with follow-up with her outpatient neurologist.   At this time, I do not feel there is any life-threatening condition present. I reviewed all nursing notes, vitals, pertinent previous records.  All lab and urine results, EKGs, imaging ordered have been independently reviewed and interpreted by myself.  I reviewed all available radiology reports from any imaging ordered this visit.  Based on my assessment, I feel the patient is safe to be discharged home without further emergent workup and can continue workup as an outpatient as needed. Discussed all findings, treatment plan as well as usual and customary return precautions.  They verbalize understanding and are comfortable with this plan.  Outpatient follow-up has been provided as needed.  All questions have been answered.   CONSULTS:  none   OUTSIDE RECORDS REVIEWED: Reviewed previous neurology notes with Dr. Gurney Maxin at [facility].       FINAL CLINICAL IMPRESSION(S) / ED DIAGNOSES   Final diagnoses:  Tingling of left upper extremity     Rx / DC Orders   ED Discharge Orders     None        Note:  This document was prepared using Dragon voice recognition software and may include unintentional dictation errors.   Paidyn Mcferran, Delice Bison, DO 05/27/22 [phone]

## 2022-05-27 NOTE — Discharge Instructions (Addendum)
CT head, labs were normal today.  I recommend close follow-up with your primary care provider and neurologist as an outpatient.

## 2022-05-27 NOTE — ED Triage Notes (Signed)
Patient ambulatory to triage with steady gait, without difficulty or distress noted; pt reports "tingling" in left arm x 7hrs; denies any other accomp symptoms

## 2022-05-31 ENCOUNTER — Telehealth: Payer: Self-pay

## 2022-05-31 NOTE — Progress Notes (Signed)
   Care Guide Note  05/31/2022 Name: Barbara Bird MRN: [id number] DOB: 07/27/1965  Referred by: Olin Hauser, DO Reason for referral : Care Coordination (Outreach to reschedule due to Pharm d being out of office )   Barbara Bird is a 57 y.o. year old female who is a primary care patient of Olin Hauser, DO. Vaneisha Horky was referred to the pharmacist for assistance related to DM.    Successful contact was made with the patient to discuss pharmacy services. Patient declines engagement at this time. Contact information was provided to the patient should they wish to reach out for assistance at a later time.  Noreene Larsson, [place], [place] [address] Direct Dial: [phone] [email or web address]

## 2022-06-04 ENCOUNTER — Telehealth: Payer: Medicaid Other

## 2023-03-25 ENCOUNTER — Other Ambulatory Visit: Payer: Self-pay | Admitting: Family Medicine

## 2023-03-25 DIAGNOSIS — E1169 Type 2 diabetes mellitus with other specified complication: Secondary | ICD-10-CM

## 2023-03-25 DIAGNOSIS — I10 Essential (primary) hypertension: Secondary | ICD-10-CM

## 2023-03-25 NOTE — Telephone Encounter (Signed)
Requested medication (s) are due for refill today: Yes  Requested medication (s) are on the active medication list: Yes  Last refill:  03/31/22  Future visit scheduled: No, pt has been notified via MyChart appt is needed.  Notes to clinic:  Unable to refill per protocol, appointment needed.      Requested Prescriptions  Pending Prescriptions Disp Refills   atorvastatin (LIPITOR) 10 MG tablet [Pharmacy Med Name: ATORVASTATIN 10 MG TABLET] 90 tablet 3    Sig: TAKE 1 TABLET BY MOUTH DAILY     Cardiovascular:  Antilipid - Statins Failed - 03/25/2023 11:15 AM      Failed - Valid encounter within last 12 months    Recent Outpatient Visits           1 year ago Type 2 diabetes mellitus with other specified complication, without long-term current use of insulin [facility])   [place] [facility] [place], Netta Neat, DO   1 year ago Type 2 diabetes mellitus with other specified complication, without long-term current use of insulin [facility])   [place] [facility] [place], Netta Neat, DO   2 years ago Type 2 diabetes mellitus with other specified complication, without long-term current use of insulin [facility])   [place] [facility] [place], Netta Neat, DO   2 years ago Type 2 diabetes mellitus with other specified complication, without long-term current use of insulin [facility])   [place] [facility] [place], Netta Neat, DO   3 years ago Annual physical exam   [facility] [place], Netta Neat, DO              Failed - Lipid Panel in normal range within the last 12 months    Cholesterol, Total  Date Value Ref Range Status  04/02/2015 97 (L) 100 - 199 mg/dL Final   Cholesterol  Date Value Ref Range Status  09/24/2021 146 <200 mg/dL Final   LDL Cholesterol (Calc)  Date Value Ref Range Status  09/24/2021 67 mg/dL (calc) Final    Comment:    Reference range:  <100 . Desirable range <100 mg/dL for primary prevention;   <70 mg/dL for patients with CHD or diabetic patients  with > or = 2 CHD risk factors. Marland Kitchen LDL-C is now calculated using the Martin-Hopkins  calculation, which is a validated novel method providing  better accuracy than the Friedewald equation in the  estimation of LDL-C.  Horald Pollen et al. Lenox Ahr. 3664;403(47): 2061-2068  (http://education.QuestDiagnostics.com/faq/FAQ164)    HDL  Date Value Ref Range Status  09/24/2021 44 (L) > OR = 50 mg/dL Final  42/59/5638 32 (L) >39 mg/dL Final   Triglycerides  Date Value Ref Range Status  09/24/2021 265 (H) <150 mg/dL Final    Comment:    . If a non-fasting specimen was collected, consider repeat triglyceride testing on a fasting specimen if clinically indicated.  Perry Mount et al. J. of Clin. Lipidol. 2015;9:129-169. Marland Kitchen          Passed - Patient is not pregnant       metFORMIN (GLUCOPHAGE) 1000 MG tablet [Pharmacy Med Name: METFORMIN HCL 1,000 MG TABLET] 90 tablet 3    Sig: TAKE 1 TABLET BY MOUTH EVERY MORNING WITH BREAKFAST     Endocrinology:  Diabetes - Biguanides Failed - 03/25/2023 11:15 AM      Failed - HBA1C is between 0 and 7.9 and within 180 days    Hgb A1c MFr  Bld  Date Value Ref Range Status  09/24/2021 7.0 (H) <5.7 % of total Hgb Final    Comment:    For someone without known diabetes, a hemoglobin A1c value of 6.5% or greater indicates that they may have  diabetes and this should be confirmed with a follow-up  test. . For someone with known diabetes, a value <7% indicates  that their diabetes is well controlled and a value  greater than or equal to 7% indicates suboptimal  control. A1c targets should be individualized based on  duration of diabetes, age, comorbid conditions, and  other considerations. . Currently, no consensus exists regarding use of hemoglobin A1c for diagnosis of diabetes for children. .          Failed - B12 Level in normal range  and within 720 days    No results found for: "VITAMINB12"       Failed - Valid encounter within last 6 months    Recent Outpatient Visits           1 year ago Type 2 diabetes mellitus with other specified complication, without long-term current use of insulin ([facility])   [place] [facility] [place], Netta Neat, DO   1 year ago Type 2 diabetes mellitus with other specified complication, without long-term current use of insulin (H[facility])   [place] [facility] [place], Netta Neat, DO   2 years ago Type 2 diabetes mellitus with other specified complication, without long-term current use of insulin (H[facility])   [place] [facility] [place], Netta Neat, DO   2 years ago Type 2 diabetes mellitus with other specified complication, without long-term current use of insulin (HCC)    [facility] [place], Netta Neat, DO   3 years ago Annual physical exam    [facility] [place], Netta Neat, DO              Failed - CBC within normal limits and completed in the last 12 months    WBC  Date Value Ref Range Status  05/27/2022 10.8 (H) 4.0 - 10.5 K/uL Final   RBC  Date Value Ref Range Status  05/27/2022 4.60 3.87 - 5.11 MIL/uL Final   Hemoglobin  Date Value Ref Range Status  05/27/2022 14.4 12.0 - 15.0 g/dL Final  11/91/4782 95.6 11.1 - 15.9 g/dL Final   HCT  Date Value Ref Range Status  05/27/2022 41.3 36.0 - 46.0 % Final   Hematocrit  Date Value Ref Range Status  11/08/2014 40.5 34.0 - 46.6 % Final   MCHC  Date Value Ref Range Status  05/27/2022 34.9 30.0 - 36.0 g/dL Final   [facility]  Date Value Ref Range Status  05/27/2022 31.3 26.0 - 34.0 pg Final   MCV  Date Value Ref Range Status  05/27/2022 89.8 80.0 - 100.0 fL Final  11/08/2014 90 79 - 97 fL Final   No results found for: "PLTCOUNTKUC", "LABPLAT", "POCPLA" RDW  Date Value Ref Range Status  05/27/2022  11.7 11.5 - 15.5 % Final  11/08/2014 13.8 12.3 - 15.4 % Final         Passed - Cr in normal range and within 360 days    Creat  Date Value Ref Range Status  09/24/2021 0.75 0.50 - 1.03 mg/dL Final   Creatinine, Ser  Date Value Ref Range Status  05/27/2022 0.63 0.44 - 1.00 mg/dL Final  Passed - eGFR in normal range and within 360 days    GFR, Est African American  Date Value Ref Range Status  02/19/2020 119 > OR = 60 mL/min/1.21m2 Final   GFR, Est Non African American  Date Value Ref Range Status  02/19/2020 102 > OR = 60 mL/min/1.62m2 Final   GFR, Estimated  Date Value Ref Range Status  05/27/2022 >60 >60 mL/min Final    Comment:    (NOTE) Calculated using the CKD-EPI Creatinine Equation (2021)    eGFR  Date Value Ref Range Status  09/24/2021 95 > OR = 60 mL/min/1.66m2 Final    Comment:    The eGFR is based on the CKD-EPI 2021 equation. To calculate  the new eGFR from a previous Creatinine or Cystatin C result, go to https://www.kidney.org/professionals/ kdoqi/gfr%5Fcalculator           lisinopril-hydrochlorothiazide (ZESTORETIC) 20-12.5 MG tablet [Pharmacy Med Name: LISINOPRIL-HCTZ 20-12.5 MG TAB] 90 tablet 3    Sig: TAKE 1 TABLET BY MOUTH DAILY     Cardiovascular:  ACEI + Diuretic Combos Failed - 03/25/2023 11:15 AM      Failed - Na in normal range and within 180 days    Sodium  Date Value Ref Range Status  05/27/2022 132 (L) 135 - 145 mmol/L Final  07/22/2015 140 134 - 144 mmol/L Final         Failed - K in normal range and within 180 days    Potassium  Date Value Ref Range Status  05/27/2022 3.8 3.5 - 5.1 mmol/L Final         Failed - Cr in normal range and within 180 days    Creat  Date Value Ref Range Status  09/24/2021 0.75 0.50 - 1.03 mg/dL Final   Creatinine, Ser  Date Value Ref Range Status  05/27/2022 0.63 0.44 - 1.00 mg/dL Final         Failed - eGFR is 30 or above and within 180 days    GFR, Est African American  Date Value  Ref Range Status  02/19/2020 119 > OR = 60 mL/min/1.74m2 Final   GFR, Est Non African American  Date Value Ref Range Status  02/19/2020 102 > OR = 60 mL/min/1.82m2 Final   GFR, Estimated  Date Value Ref Range Status  05/27/2022 >60 >60 mL/min Final    Comment:    (NOTE) Calculated using the CKD-EPI Creatinine Equation (2021)    eGFR  Date Value Ref Range Status  09/24/2021 95 > OR = 60 mL/min/1.74m2 Final    Comment:    The eGFR is based on the CKD-EPI 2021 equation. To calculate  the new eGFR from a previous Creatinine or Cystatin C result, go to https://www.kidney.org/professionals/ kdoqi/gfr%5Fcalculator          Failed - Valid encounter within last 6 months    Recent Outpatient Visits           1 year ago Type 2 diabetes mellitus with other specified complication, without long-term current use of insulin [facility])   [place] [facility] [place], Netta Neat, DO   1 year ago Type 2 diabetes mellitus with other specified complication, without long-term current use of insulin [facility])   [place] [facility] [place], Netta Neat, DO   2 years ago Type 2 diabetes mellitus with other specified complication, without long-term current use of insulin [facility])   [place] [facility] [place], Netta Neat, DO   2 years ago  Type 2 diabetes mellitus with other specified complication, without long-term current use of insulin [facility])   [place] [facility] [place], Netta Neat, DO   3 years ago Annual physical exam   [place] [facility] [place], [place]              Passed - Patient is not pregnant      Passed - Last BP in normal range    BP Readings from Last 1 Encounters:  05/27/22 128/74

## 2023-04-07 ENCOUNTER — Encounter: Payer: Self-pay | Admitting: Family Medicine

## 2023-04-07 ENCOUNTER — Other Ambulatory Visit: Payer: Self-pay | Admitting: Family Medicine

## 2023-04-07 ENCOUNTER — Ambulatory Visit: Payer: Medicaid Other | Admitting: Family Medicine

## 2023-04-07 VITALS — BP 130/78 | HR 108 | Ht 64.0 in | Wt 224.0 lb

## 2023-04-07 DIAGNOSIS — Z0000 Encounter for general adult medical examination without abnormal findings: Secondary | ICD-10-CM | POA: Diagnosis not present

## 2023-04-07 DIAGNOSIS — M797 Fibromyalgia: Secondary | ICD-10-CM

## 2023-04-07 DIAGNOSIS — E1169 Type 2 diabetes mellitus with other specified complication: Secondary | ICD-10-CM

## 2023-04-07 DIAGNOSIS — E785 Hyperlipidemia, unspecified: Secondary | ICD-10-CM | POA: Diagnosis not present

## 2023-04-07 DIAGNOSIS — R7989 Other specified abnormal findings of blood chemistry: Secondary | ICD-10-CM

## 2023-04-07 DIAGNOSIS — I10 Essential (primary) hypertension: Secondary | ICD-10-CM

## 2023-04-07 MED ORDER — METFORMIN HCL 500 MG PO TABS: 500.0000 mg | ORAL_TABLET | Freq: Every day | ORAL | 3 refills | Status: DC

## 2023-04-07 MED ORDER — METFORMIN HCL 1000 MG PO TABS: 1000.0000 mg | ORAL_TABLET | Freq: Every day | ORAL | 3 refills | Status: DC

## 2023-04-07 MED ORDER — LISINOPRIL-HYDROCHLOROTHIAZIDE 20-12.5 MG PO TABS: 1.0000 | ORAL_TABLET | Freq: Every day | ORAL | 3 refills | Status: AC

## 2023-04-07 MED ORDER — ATORVASTATIN CALCIUM 10 MG PO TABS: 10.0000 mg | ORAL_TABLET | Freq: Every day | ORAL | 3 refills | Status: AC

## 2023-04-07 NOTE — Patient Instructions (Addendum)
Thank you for coming to the office today.  Labs today  All medicines refilled for 1 year.  Referral   Dr Bing Ree [facility] [address] [place], [place] [address] Contact Phone: [phone] Fax: [phone]   Recommend Diabetic Eye Exam - please call to schedule anytime. Have them fax [place] a copy of the report, Fax [phone]  [facility]   Address: [address], [place] [address] Phone: [phone]  Website: [email or web address]   [facility] [address] [place], [place] [address] Phone: [phone] [email or web address]  [facility]  Address: [address] [place], [place], [place] [address] Phone: [phone]   [facility] [address] [place], [place] [address] Phone: [phone]  [facility] Address: [address] [place], [place], [place] [address]  Phone: [phone]  ---------------   You have been referred for a Coronary Calcium Score Cardiac CT Scan. This is a screening test for patients aged 10-50+ with cardiovascular risk factors or who are healthy but would be interested in Cardiovascular Screening for heart disease. Even if there is a family history of heart disease, this imaging can be useful. Typically it can be done every 5+ years or at a different timeline we agree on  The scan will look at the chest and mainly focus on the heart and identify early signs of calcium build up or blockages within the heart arteries. It is not 100% accurate for identifying blockages or heart disease, but it is useful to help [place] predict who may have some early changes or be at risk in the future for a heart attack or cardiovascular problem.  The results are reviewed by a Cardiologist and they will document the results. It should become available on MyChart. Typically the results are divided into percentiles based on other patients of the same demographic and age. So it will compare your risk to others similar to  you. If you have a higher score >99 or higher percentile >75%tile, it is recommended to consider Statin cholesterol therapy and or referral to Cardiologist. I will try to help explain your results and if we have questions we can contact the Cardiologist.  You will be contacted for scheduling. Usually it is done at any imaging facility through [facility], [facility] or [facility].  The cost is $99 flat fee total and it does not go through insurance, so no authorization is required.    Please schedule a Follow-up Appointment to: Return for 1 year fasting lab > 1 week later Annual Physical.  If you have any other questions or concerns, please feel free to call the office or send a message through MyChart. You may also schedule an earlier appointment if necessary.  Additionally, you may be receiving a survey about your experience at our office within a few days to 1 week by e-mail or mail. We value your feedback.  Saralyn Pilar, DO [facility], [place]

## 2023-04-07 NOTE — Progress Notes (Signed)
Subjective:    Patient ID: Barbara Bird, female    DOB: 07/21/1965, 57 y.o.   MRN: [id number]  Barbara Bird is a 57 y.o. female presenting on 04/07/2023 for Diabetes   HPI  Discussed the use of AI scribe software for clinical note transcription with the patient, who gave verbal consent to proceed.  History of Present Illness     The patient, with a history of diabetes, hypertension, and hyperlipidemia, presents for an annual check-up. She reports a significant weight loss of over 20 pounds since the last visit, and her blood pressure is well-controlled. The patient is compliant with her prescribed medications, including metformin, lisinopril, and atorvastatin.  The patient has been experiencing some fluid retention in her feet and ankles, which she manages with compression socks. She also reports daily fluctuations in her vision since a previous accident, which has made reading and understanding information challenging. She has not seen an ophthalmologist recently due to previous conflicting diagnoses regarding her vision issues.  The patient also mentions a previous suggestion from a pain management specialist about the possibility of fibromyalgia. She describes her pain as a "Rubik's cube," constantly changing in location and intensity. However, she has not pursued a formal diagnosis due to her lack of faith in pain management treatments. - Previously followed by [facility] for pain and also [facility]  The patient is up-to-date with her vaccinations, having received a COVID-19 booster shot and a flu shot last month. She is also compliant with her daily multivitamin regimen, which was initiated three years ago when her B12 levels were found to be low.  The patient is proactive about her health, expressing interest in a heart scan to check for potential artery blockages and cholesterol buildup. She also plans to schedule an eye exam to monitor her diabetes. She is open to rechecking  her blood sugar levels in six months, depending on the results of her current lab work.      BP controlled Weight down 20 lbs in past 1 year down to 224 lbs  Type 2 Diabetes Last A1c 7.0, prior range 6-7 Has done well improved diet. Continues on metformin 1000mg  and 500mg  daily for total 1500, takes one in AM and one in PM Tolerating med well Has had eye exams for blurred vision, she will check on diabetes eye exam  Question Fibromyalgia diagnosis Previously suggested by prior Ortho/Pain in [facility] [place] Chronic myofascial pain [facility] etiology Also seen [facility] Requesting new referral now   Hyperlipidemia HyperTG Improved, likely component of genetic factor in past has had 200-400 range She has improved on this now Continues on Atorvastatin with good results for LDL cholesterol     Health Maintenance:  Last Colonoscopy 04/11/19, next due 10 year, 2030.      04/07/2023   10:15 AM 04/08/2021   10:49 AM 04/08/2021   10:48 AM  Depression screen PHQ 2/9  Decreased Interest 3  1  Down, Depressed, Hopeless 2  1  PHQ - 2 Score 5  2  Altered sleeping 3  1  Tired, decreased energy 3 1   Change in appetite 3 2   Feeling bad or failure about yourself  0 2   Trouble concentrating 2 2   Moving slowly or fidgety/restless 3 0   Suicidal thoughts 0 0   PHQ-9 Score 19  3       04/07/2023   10:16 AM 04/08/2021   10:50 AM  GAD 7 : Generalized Anxiety  Score  Nervous, Anxious, on Edge 2 3  Control/stop worrying 2 3  Worry too much - different things 3 3  Trouble relaxing 3 3  Restless 0 2  Easily annoyed or irritable 2 2  Afraid - awful might happen 2 3  Total GAD 7 Score 14 19     Past Medical History:  Diagnosis Date   Diabetes mellitus without complication (HCC)    Hypercholesteremia    Hypertension    Past Surgical History:  Procedure Laterality Date   GALLBLADDER SURGERY     TUBAL LIGATION     Social History   Socioeconomic History   Marital  status: Married    Spouse name: Not on file   Number of children: Not on file   Years of education: Not on file   Highest education level: Associate degree: occupational, Scientist, product/process development, or vocational program  Occupational History   Not on file  Tobacco Use   Smoking status: Never   Smokeless tobacco: Never  Vaping Use   Vaping status: Never Used  Substance and Sexual Activity   Alcohol use: Yes    Comment: occasional   Drug use: No   Sexual activity: Yes    Birth control/protection: Surgical  Other Topics Concern   Not on file  Social History Narrative   Not on file   Social Drivers of Health   Financial Resource Strain: High Risk (04/06/2023)   Overall Financial Resource Strain (CARDIA)    Difficulty of Paying Living Expenses: Very hard  Food Insecurity: No Food Insecurity (04/06/2023)   Hunger Vital Sign    Worried About Running Out of Food in the Last Year: Never true    Ran Out of Food in the Last Year: Never true  Transportation Needs: No Transportation Needs (04/06/2023)   PRAPARE - Administrator, Civil Service (Medical): No    Lack of Transportation (Non-Medical): No  Physical Activity: Unknown (04/06/2023)   Exercise Vital Sign    Days of Exercise per Week: 0 days    Minutes of Exercise per Session: Not on file  Stress: Stress Concern Present (04/06/2023)   [employer] of Occupational Health - Occupational Stress Questionnaire    Feeling of Stress : To some extent  Social Connections: Unknown (04/06/2023)   Social Connection and Isolation Panel [NHANES]    Frequency of Communication with Friends and Family: Never    Frequency of Social Gatherings with Friends and Family: Patient declined    Attends Religious Services: Never    Database administrator or Organizations: Patient declined    Attends Engineer, structural: Not on file    Marital Status: Married  Intimate Partner Violence: Unknown (07/13/2021)   Received from [employer],  [facility]   HITS    Physically Hurt: Not on file    Insult or Talk Down To: Not on file    Threaten Physical Harm: Not on file    Scream or Curse: Not on file   Family History  Problem Relation Age of Onset   Stroke Father 61   Diabetes Father    Cancer Maternal Aunt        Great Aunt   Mental illness Maternal Grandmother    Current Outpatient Medications on File Prior to Visit  Medication Sig   diclofenac Sodium (VOLTAREN) 1 % GEL Apply 2 g topically 4 (four) times daily.   lidocaine (LIDODERM) 5 % Place onto the skin.   Menthol, Topical Analgesic, 10 %  LIQD    mometasone (NASONEX) 50 MCG/ACT nasal spray as needed.   Nerve Stimulator (STANDARD TENS) DEVI by Does not apply route.   No current facility-administered medications on file prior to visit.    Review of Systems  Constitutional:  Negative for activity change, appetite change, chills, diaphoresis, fatigue and fever.  HENT:  Negative for congestion and hearing loss.   Eyes:  Negative for visual disturbance.  Respiratory:  Negative for cough, chest tightness, shortness of breath and wheezing.   Cardiovascular:  Negative for chest pain, palpitations and leg swelling.  Gastrointestinal:  Negative for abdominal pain, constipation, diarrhea, nausea and vomiting.  Genitourinary:  Negative for dysuria, frequency and hematuria.  Musculoskeletal:  Positive for arthralgias. Negative for neck pain.  Skin:  Negative for rash.  Neurological:  Negative for dizziness, weakness, light-headedness, numbness and headaches.  Hematological:  Negative for adenopathy.  Psychiatric/Behavioral:  Negative for behavioral problems, dysphoric mood and sleep disturbance.    Per HPI unless specifically indicated above     Objective:    BP 130/78   Pulse (!) 108   Ht 5\' 4"  (1.626 m)   Wt 224 lb (101.6 kg)   LMP 02/28/2016 (Approximate)   SpO2 100%   BMI 38.45 kg/m   Wt Readings from Last 3 Encounters:  04/07/23 224 lb (101.6 kg)   05/27/22 242 lb (109.8 kg)  09/29/21 218 lb (98.9 kg)    Physical Exam Vitals and nursing note reviewed.  Constitutional:      General: She is not in acute distress.    Appearance: She is well-developed. She is obese. She is not diaphoretic.     Comments: Well-appearing, comfortable, cooperative  HENT:     Head: Normocephalic and atraumatic.  Eyes:     General:        Right eye: No discharge.        Left eye: No discharge.     Conjunctiva/sclera: Conjunctivae normal.     Pupils: Pupils are equal, round, and reactive to light.  Neck:     Thyroid: No thyromegaly.  Cardiovascular:     Rate and Rhythm: Normal rate and regular rhythm.     Pulses: Normal pulses.     Heart sounds: Normal heart sounds. No murmur heard. Pulmonary:     Effort: Pulmonary effort is normal. No respiratory distress.     Breath sounds: Normal breath sounds. No wheezing or rales.  Abdominal:     General: Bowel sounds are normal. There is no distension.     Palpations: Abdomen is soft. There is no mass.     Tenderness: There is no abdominal tenderness.  Musculoskeletal:        General: No tenderness. Normal range of motion.     Cervical back: Normal range of motion and neck supple.     Comments: Upper / Lower Extremities: - Normal muscle tone, strength bilateral upper extremities 5/5, lower extremities 5/5  Lymphadenopathy:     Cervical: No cervical adenopathy.  Skin:    General: Skin is warm and dry.     Findings: No erythema or rash.  Neurological:     Mental Status: She is alert and oriented to person, place, and time.     Comments: Distal sensation intact to light touch all extremities  Psychiatric:        Mood and Affect: Mood normal.        Behavior: Behavior normal.        Thought Content: Thought content normal.  Comments: Well groomed, good eye contact, normal speech and thoughts     Diabetic Foot Exam - Simple   Simple Foot Form Diabetic Foot exam was performed with the following  findings: Yes 04/07/2023 10:44 AM  Visual Inspection No deformities, no ulcerations, no other skin breakdown bilaterally: Yes Sensation Testing Intact to touch and monofilament testing bilaterally: Yes Pulse Check Posterior Tibialis and Dorsalis pulse intact bilaterally: Yes Comments      Results for orders placed or performed during the hospital encounter of 05/27/22  CBC   Collection Time: 05/27/22  3:15 AM  Result Value Ref Range   WBC 10.8 (H) 4.0 - 10.5 K/uL   RBC 4.60 3.87 - 5.11 MIL/uL   Hemoglobin 14.4 12.0 - 15.0 g/dL   HCT 16.1 09.6 - 04.5 %   MCV 89.8 80.0 - 100.0 fL   MCH 31.3 26.0 - 34.0 pg   MCHC 34.9 30.0 - 36.0 g/dL   RDW 40.9 81.1 - 91.4 %   Platelets 299 150 - 400 K/uL   nRBC 0.0 0.0 - 0.2 %  Basic metabolic panel   Collection Time: 05/27/22  3:15 AM  Result Value Ref Range   Sodium 132 (L) 135 - 145 mmol/L   Potassium 3.8 3.5 - 5.1 mmol/L   Chloride 103 98 - 111 mmol/L   CO2 19 (L) 22 - 32 mmol/L   Glucose, Bld 241 (H) 70 - 99 mg/dL   BUN 17 6 - 20 mg/dL   Creatinine, Ser 7.82 0.44 - 1.00 mg/dL   Calcium 8.8 (L) 8.9 - 10.3 mg/dL   GFR, Estimated >95 >62 mL/min   Anion gap 10 5 - 15  Troponin I (High Sensitivity)   Collection Time: 05/27/22  3:15 AM  Result Value Ref Range   Troponin I (High Sensitivity) 3 <18 ng/L      Assessment & Plan:   Problem List Items Addressed This Visit     Hyperlipidemia associated with type 2 diabetes mellitus (HCC)   Relevant Medications   atorvastatin (LIPITOR) 10 MG tablet   metFORMIN (GLUCOPHAGE) 500 MG tablet   metFORMIN (GLUCOPHAGE) 1000 MG tablet   lisinopril-hydrochlorothiazide (ZESTORETIC) 20-12.5 MG tablet   Other Relevant Orders   Lipid panel   TSH   CT CARDIAC SCORING (SELF PAY ONLY)   Morbid obesity (HCC)   Relevant Medications   metFORMIN (GLUCOPHAGE) 500 MG tablet   metFORMIN (GLUCOPHAGE) 1000 MG tablet   Other Relevant Orders   CT CARDIAC SCORING (SELF PAY ONLY)   Type 2 diabetes mellitus  with other specified complication (HCC)   Relevant Medications   atorvastatin (LIPITOR) 10 MG tablet   metFORMIN (GLUCOPHAGE) 500 MG tablet   metFORMIN (GLUCOPHAGE) 1000 MG tablet   lisinopril-hydrochlorothiazide (ZESTORETIC) 20-12.5 MG tablet   Other Relevant Orders   Hemoglobin A1c   Microalbumin / creatinine urine ratio   CT CARDIAC SCORING (SELF PAY ONLY)   Other Visit Diagnoses       Annual physical exam    -  Primary   Relevant Orders   Lipid panel   Hemoglobin A1c   CBC with Differential/Platelet   COMPLETE METABOLIC PANEL WITH GFR   TSH     Essential hypertension       Relevant Medications   atorvastatin (LIPITOR) 10 MG tablet   lisinopril-hydrochlorothiazide (ZESTORETIC) 20-12.5 MG tablet   Other Relevant Orders   CBC with Differential/Platelet   COMPLETE METABOLIC PANEL WITH GFR     Elevated TSH  Fibromyalgia       Relevant Orders   Ambulatory referral to Psychiatry        Updated Health Maintenance information Fasting labs ordered  Encouraged improvement to lifestyle with diet and exercise Goal of weight loss   Diabetes Mellitus Type 2 Improving control Pending A1c -Continue Metformin 1000mg  in the morning and 500mg  in the evening. -Order labs today including chemistry, blood count, cholesterol, A1c, and thyroid function. DM Foot exam, Mircroalbumin -Recommend annual diabetic eye exam with an optometrist.  Hypertension Well controlled with current regimen. -Continue Lisinopril and Hydrochlorothiazide.  Hyperlipidemia No changes reported in medication regimen. -Continue Atorvastatin.  Possible Fibromyalgia Patient reports chronic, shifting pain patterns. Chronic myofascial pain, had been evaluated by multiple previous pain management specialist in past. No formal diagnosis made. -Refer to pain management for further evaluation and possible confirmation of diagnosis.  General Health Maintenance: -Update vaccinations Flu and COVID-19  booster received last month. -Continue daily multivitamin. -Order heart CT coronary calcium score scan for screening of artery blockages. -Schedule annual visit for next year, with potential 28-month check if needed based on lab results.         Orders Placed This Encounter  Procedures   CT CARDIAC SCORING (SELF PAY ONLY)    Standing Status:   Future    Expiration Date:   04/06/2024    Is patient pregnant?:   No    Preferred imaging location?:   [facility]   Lipid panel    Has the patient fasted?:   Yes   Hemoglobin A1c   CBC with Differential/Platelet   COMPLETE METABOLIC PANEL WITH GFR   Microalbumin / creatinine urine ratio   TSH   Ambulatory referral to Psychiatry    Referral Priority:   Routine    Referral Type:   Psychiatric    Referral Reason:   Specialty Services Required    Requested Specialty:   Psychiatry    Number of Visits Requested:   1    Meds ordered this encounter  Medications   atorvastatin (LIPITOR) 10 MG tablet    Sig: Take 1 tablet (10 mg total) by mouth daily.    Dispense:  90 tablet    Refill:  3   metFORMIN (GLUCOPHAGE) 500 MG tablet    Sig: Take 1 tablet (500 mg total) by mouth daily with supper. Note also taking 1000mg  in morning with breakfast    Dispense:  90 tablet    Refill:  3    Add refills   metFORMIN (GLUCOPHAGE) 1000 MG tablet    Sig: Take 1 tablet (1,000 mg total) by mouth daily with breakfast. Other order is for 500mg  in evening with meal.    Dispense:  90 tablet    Refill:  3   lisinopril-hydrochlorothiazide (ZESTORETIC) 20-12.5 MG tablet    Sig: Take 1 tablet by mouth daily.    Dispense:  90 tablet    Refill:  3     Follow up plan: Return for 1 year fasting lab > 1 week later Annual Physical.  Future labs 03/27/24  Saralyn Pilar, DO [facility] 04/07/2023, 10:26 AM

## 2023-04-08 LAB — COMPLETE METABOLIC PANEL WITH GFR
AG Ratio: 2.1 (calc) (ref 1.0–2.5)
ALT: 18 U/L (ref 6–29)
AST: 12 U/L (ref 10–35)
Albumin: 5.1 g/dL (ref 3.6–5.1)
Alkaline phosphatase (APISO): 80 U/L (ref 37–153)
BUN: 22 mg/dL (ref 7–25)
CO2: 26 mmol/L (ref 20–32)
Calcium: 10 mg/dL (ref 8.6–10.4)
Chloride: 102 mmol/L (ref 98–110)
Creat: 0.78 mg/dL (ref 0.50–1.03)
Globulin: 2.4 g/dL (ref 1.9–3.7)
Glucose, Bld: 175 mg/dL — ABNORMAL HIGH (ref 65–99)
Potassium: 4.1 mmol/L (ref 3.5–5.3)
Sodium: 142 mmol/L (ref 135–146)
Total Bilirubin: 0.5 mg/dL (ref 0.2–1.2)
Total Protein: 7.5 g/dL (ref 6.1–8.1)
eGFR: 89 mL/min/{1.73_m2} (ref >=60)

## 2023-04-08 LAB — MICROALBUMIN / CREATININE URINE RATIO
Creatinine, Urine: 91 mg/dL (ref 20–275)
Microalb Creat Ratio: 4 mg/g{creat} (ref <30)
Microalb, Ur: 0.4 mg/dL

## 2023-04-08 LAB — CBC WITH DIFFERENTIAL/PLATELET
Absolute Lymphocytes: 3213 {cells}/uL (ref 850–3900)
Absolute Monocytes: 643 {cells}/uL (ref 200–950)
Basophils Absolute: 92 {cells}/uL (ref 0–200)
Basophils Relative: 0.9 %
Eosinophils Absolute: 112 {cells}/uL (ref 15–500)
Eosinophils Relative: 1.1 %
HCT: 47.8 % — ABNORMAL HIGH (ref 35.0–45.0)
Hemoglobin: 16.1 g/dL — ABNORMAL HIGH (ref 11.7–15.5)
MCH: 31.7 pg (ref 27.0–33.0)
MCHC: 33.7 g/dL (ref 32.0–36.0)
MCV: 94.1 fL (ref 80.0–100.0)
MPV: 10.6 fL (ref 7.5–12.5)
Monocytes Relative: 6.3 %
Neutro Abs: 6140 {cells}/uL (ref 1500–7800)
Neutrophils Relative %: 60.2 %
Platelets: 370 10*3/uL (ref 140–400)
RBC: 5.08 10*6/uL (ref 3.80–5.10)
RDW: 11.8 % (ref 11.0–15.0)
Total Lymphocyte: 31.5 %
WBC: 10.2 10*3/uL (ref 3.8–10.8)

## 2023-04-08 LAB — TSH: TSH: 1.38 m[IU]/L (ref 0.40–4.50)

## 2023-04-08 LAB — HEMOGLOBIN A1C
Hgb A1c MFr Bld: 7.7 %{Hb} — ABNORMAL HIGH (ref <5.7)
Mean Plasma Glucose: 174 mg/dL
eAG (mmol/L): 9.7 mmol/L

## 2023-04-08 LAB — LIPID PANEL
Cholesterol: 170 mg/dL (ref <200)
HDL: 44 mg/dL — ABNORMAL LOW (ref >=50)
LDL Cholesterol (Calc): 84 mg/dL
Non-HDL Cholesterol (Calc): 126 mg/dL (ref <130)
Total CHOL/HDL Ratio: 3.9 (calc) (ref <5.0)
Triglycerides: 346 mg/dL — ABNORMAL HIGH (ref <150)

## 2023-04-11 ENCOUNTER — Ambulatory Visit
Admission: RE | Admit: 2023-04-11 | Discharge: 2023-04-11 | Disposition: A | Discharge status: Home / Self Care | Payer: Self-pay | Source: Ambulatory Visit | Attending: Family Medicine | Admitting: Family Medicine

## 2023-04-11 DIAGNOSIS — E785 Hyperlipidemia, unspecified: Secondary | ICD-10-CM

## 2023-04-11 DIAGNOSIS — E1169 Type 2 diabetes mellitus with other specified complication: Secondary | ICD-10-CM

## 2023-12-24 ENCOUNTER — Other Ambulatory Visit: Payer: Self-pay | Admitting: Family Medicine

## 2023-12-24 DIAGNOSIS — E1169 Type 2 diabetes mellitus with other specified complication: Secondary | ICD-10-CM

## 2023-12-26 NOTE — Telephone Encounter (Signed)
 Rx 04/07/23 #90 3RF- 1 year supply Requested Prescriptions  Pending Prescriptions Disp Refills   metFORMIN  (GLUCOPHAGE ) 500 MG tablet [Pharmacy Med Name: METFORMIN  HCL 500 MG TABLET] 90 tablet 3    Sig: TAKE 1 TABLET (500 MG TOTAL) BY MOUTH DAILY WITH SUPPER. NOTE ALSO TAKING 1000MG  IN MORNING WITH BREAKFAST     Endocrinology:  Diabetes - Biguanides Failed - 12/26/2023  3:34 PM      Failed - HBA1C is between 0 and 7.9 and within 180 days    Hgb A1c MFr Bld  Date Value Ref Range Status  04/07/2023 7.7 (H) <5.7 % of total Hgb Final    Comment:    For someone without known diabetes, a hemoglobin A1c value of 6.5% or greater indicates that they may have  diabetes and this should be confirmed with a follow-up  test. . For someone with known diabetes, a value <7% indicates  that their diabetes is well controlled and a value  greater than or equal to 7% indicates suboptimal  control. A1c targets should be individualized based on  duration of diabetes, age, comorbid conditions, and  other considerations. . Currently, no consensus exists regarding use of hemoglobin A1c for diagnosis of diabetes for children. .          Failed - B12 Level in normal range and within 720 days    No results found for: VITAMINB12       Failed - Valid encounter within last 6 months    Recent Outpatient Visits   None     Future Appointments             In 3 months Karamalegos, Marsa PARAS, DO [facility] [address]            Passed - Cr in normal range and within 360 days    Creat  Date Value Ref Range Status  04/07/2023 0.78 0.50 - 1.03 mg/dL Final   Creatinine, Urine  Date Value Ref Range Status  04/07/2023 91 20 - 275 mg/dL Final         Passed - eGFR in normal range and within 360 days    GFR, Est African American  Date Value Ref Range Status  02/19/2020 119 > OR = 60 mL/min/1.22m2 Final   GFR, Est Non African American  Date Value Ref Range Status   02/19/2020 102 > OR = 60 mL/min/1.34m2 Final   GFR, Estimated  Date Value Ref Range Status  05/27/2022 >60 >60 mL/min Final    Comment:    (NOTE) Calculated using the CKD-EPI Creatinine Equation (2021)    eGFR  Date Value Ref Range Status  04/07/2023 89 > OR = 60 mL/min/1.52m2 Final         Passed - CBC within normal limits and completed in the last 12 months    WBC  Date Value Ref Range Status  04/07/2023 10.2 3.8 - 10.8 Thousand/uL Final   RBC  Date Value Ref Range Status  04/07/2023 5.08 3.80 - 5.10 Million/uL Final   Hemoglobin  Date Value Ref Range Status  04/07/2023 16.1 (H) 11.7 - 15.5 g/dL Final  92/70/7983 85.8 11.1 - 15.9 g/dL Final   HCT  Date Value Ref Range Status  04/07/2023 47.8 (H) 35.0 - 45.0 % Final   Hematocrit  Date Value Ref Range Status  11/08/2014 40.5 34.0 - 46.6 % Final   MCHC  Date Value Ref Range Status  04/07/2023 33.7 32.0 - 36.0 g/dL  Final    Comment:    For adults, a slight decrease in the calculated MCHC value (in the range of 30 to 32 g/dL) is most likely not clinically significant; however, it should be interpreted with caution in correlation with other red cell parameters and the patient's clinical condition.    [facility]  Date Value Ref Range Status  04/07/2023 31.7 27.0 - 33.0 pg Final   MCV  Date Value Ref Range Status  04/07/2023 94.1 80.0 - 100.0 fL Final  11/08/2014 90 79 - 97 fL Final   No results found for: PLTCOUNTKUC, LABPLAT, POCPLA RDW  Date Value Ref Range Status  04/07/2023 11.8 11.0 - 15.0 % Final  11/08/2014 13.8 12.3 - 15.4 % Final

## 2023-12-27 ENCOUNTER — Telehealth: Payer: Self-pay

## 2023-12-27 DIAGNOSIS — E1169 Type 2 diabetes mellitus with other specified complication: Secondary | ICD-10-CM

## 2023-12-27 MED ORDER — METFORMIN HCL 500 MG PO TABS: 500.0000 mg | ORAL_TABLET | Freq: Every day | ORAL | 3 refills | Status: AC

## 2023-12-27 MED ORDER — METFORMIN HCL 1000 MG PO TABS: 1000.0000 mg | ORAL_TABLET | Freq: Every day | ORAL | 3 refills | Status: AC

## 2023-12-27 NOTE — Telephone Encounter (Signed)
 Copied from CRM #[id number]. Topic: Clinical - Medication Question >> Dec 27, 2023  9:34 AM Myrick T wrote: Reason for CRM: please f/u with patient as her pharmacy requested a refill for metFORMIN  (GLUCOPHAGE ) 500 MG tablet [Pharmacy Med Name: METFORMIN  HCL 500 MG TABLET] and does not know why it says refill was denied because requested too soon.

## 2023-12-27 NOTE — Telephone Encounter (Signed)
 Called pharmacy to verify if patient had any refills on file. Rx sent to pharmacy. Patient advised.

## 2024-03-27 ENCOUNTER — Other Ambulatory Visit: Payer: Self-pay

## 2024-03-27 DIAGNOSIS — R7989 Other specified abnormal findings of blood chemistry: Secondary | ICD-10-CM

## 2024-03-27 DIAGNOSIS — Z0000 Encounter for general adult medical examination without abnormal findings: Secondary | ICD-10-CM

## 2024-03-27 DIAGNOSIS — I10 Essential (primary) hypertension: Secondary | ICD-10-CM

## 2024-03-27 DIAGNOSIS — E1169 Type 2 diabetes mellitus with other specified complication: Secondary | ICD-10-CM

## 2024-03-30 ENCOUNTER — Ambulatory Visit: Payer: Self-pay | Admitting: Family Medicine

## 2024-04-04 LAB — COMPLETE METABOLIC PANEL WITHOUT GFR
AG Ratio: 2.2 (calc) (ref 1.0–2.5)
ALT: 15 U/L (ref 6–29)
AST: 10 U/L (ref 10–35)
Albumin: 4.6 g/dL (ref 3.6–5.1)
Alkaline phosphatase (APISO): 68 U/L (ref 37–153)
BUN: 18 mg/dL (ref 7–25)
CO2: 24 mmol/L (ref 20–32)
Calcium: 10.5 mg/dL — ABNORMAL HIGH (ref 8.6–10.4)
Chloride: 103 mmol/L (ref 98–110)
Creat: 0.65 mg/dL (ref 0.50–1.03)
Globulin: 2.1 g/dL (ref 1.9–3.7)
Glucose, Bld: 271 mg/dL — ABNORMAL HIGH (ref 65–99)
Potassium: 4 mmol/L (ref 3.5–5.3)
Sodium: 137 mmol/L (ref 135–146)
Total Bilirubin: 0.5 mg/dL (ref 0.2–1.2)
Total Protein: 6.7 g/dL (ref 6.1–8.1)

## 2024-04-04 LAB — MICROALBUMIN / CREATININE URINE RATIO
Creatinine, Urine: 107 mg/dL (ref 20–275)
Microalb Creat Ratio: 28 mg/g{creat}
Microalb, Ur: 3 mg/dL

## 2024-04-04 LAB — CBC WITH DIFFERENTIAL/PLATELET
Absolute Lymphocytes: 3322 {cells}/uL (ref 850–3900)
Absolute Monocytes: 643 {cells}/uL (ref 200–950)
Basophils Absolute: 67 {cells}/uL (ref 0–200)
Basophils Relative: 0.7 %
Eosinophils Absolute: 173 {cells}/uL (ref 15–500)
Eosinophils Relative: 1.8 %
HCT: 45.3 % (ref 35.9–46.0)
Hemoglobin: 14.8 g/dL (ref 11.7–15.5)
MCH: 31.4 pg (ref 27.0–33.0)
MCHC: 32.7 g/dL (ref 31.6–35.4)
MCV: 96.2 fL (ref 81.4–101.7)
MPV: 10.5 fL (ref 7.5–12.5)
Monocytes Relative: 6.7 %
Neutro Abs: 5395 {cells}/uL (ref 1500–7800)
Neutrophils Relative %: 56.2 %
Platelets: 319 Thousand/uL (ref 140–400)
RBC: 4.71 Million/uL (ref 3.80–5.10)
RDW: 12 % (ref 11.0–15.0)
Total Lymphocyte: 34.6 %
WBC: 9.6 Thousand/uL (ref 3.8–10.8)

## 2024-04-04 LAB — LIPID PANEL
Cholesterol: 191 mg/dL
HDL: 35 mg/dL — ABNORMAL LOW
Non-HDL Cholesterol (Calc): 156 mg/dL — ABNORMAL HIGH
Total CHOL/HDL Ratio: 5.5 (calc) — ABNORMAL HIGH
Triglycerides: 576 mg/dL — ABNORMAL HIGH

## 2024-04-04 LAB — TSH: TSH: 2.29 m[IU]/L (ref 0.40–4.50)

## 2024-04-04 LAB — HEMOGLOBIN A1C
Hgb A1c MFr Bld: 9.5 % — ABNORMAL HIGH
Mean Plasma Glucose: 226 mg/dL
eAG (mmol/L): 12.5 mmol/L

## 2024-04-09 ENCOUNTER — Other Ambulatory Visit: Payer: Medicaid Other

## 2024-04-10 ENCOUNTER — Ambulatory Visit (INDEPENDENT_AMBULATORY_CARE_PROVIDER_SITE_OTHER): Admitting: Family Medicine

## 2024-04-10 ENCOUNTER — Other Ambulatory Visit: Payer: Self-pay | Admitting: Family Medicine

## 2024-04-10 ENCOUNTER — Encounter: Payer: Self-pay | Admitting: Family Medicine

## 2024-04-10 VITALS — BP 120/72 | HR 94 | Ht 64.0 in | Wt 224.0 lb

## 2024-04-10 DIAGNOSIS — F0781 Postconcussional syndrome: Secondary | ICD-10-CM

## 2024-04-10 DIAGNOSIS — I10 Essential (primary) hypertension: Secondary | ICD-10-CM | POA: Diagnosis not present

## 2024-04-10 DIAGNOSIS — Z124 Encounter for screening for malignant neoplasm of cervix: Secondary | ICD-10-CM | POA: Diagnosis not present

## 2024-04-10 DIAGNOSIS — Z7984 Long term (current) use of oral hypoglycemic drugs: Secondary | ICD-10-CM | POA: Diagnosis not present

## 2024-04-10 DIAGNOSIS — F4310 Post-traumatic stress disorder, unspecified: Secondary | ICD-10-CM | POA: Diagnosis not present

## 2024-04-10 DIAGNOSIS — Z0000 Encounter for general adult medical examination without abnormal findings: Secondary | ICD-10-CM | POA: Diagnosis not present

## 2024-04-10 DIAGNOSIS — E785 Hyperlipidemia, unspecified: Secondary | ICD-10-CM | POA: Diagnosis not present

## 2024-04-10 DIAGNOSIS — E1169 Type 2 diabetes mellitus with other specified complication: Secondary | ICD-10-CM

## 2024-04-10 DIAGNOSIS — Z1231 Encounter for screening mammogram for malignant neoplasm of breast: Secondary | ICD-10-CM | POA: Diagnosis not present

## 2024-04-10 DIAGNOSIS — Z23 Encounter for immunization: Secondary | ICD-10-CM

## 2024-04-10 DIAGNOSIS — Z789 Other specified health status: Secondary | ICD-10-CM

## 2024-04-10 DIAGNOSIS — Z87820 Personal history of traumatic brain injury: Secondary | ICD-10-CM

## 2024-04-10 NOTE — Progress Notes (Signed)
 "  Subjective:    Patient ID: Barbara Bird, female    DOB: 1965-06-07, 58 y.o.   MRN: [id number]  Barbara Bird is a 58 y.o. female presenting on 04/10/2024 for Annual Exam   HPI  Discussed the use of AI scribe software for clinical note transcription with the patient, who gave verbal consent to proceed.  History of Present Illness   Barbara Bird is a 58 year old female who presents for an annual physical exam.  Type 2 Diabetes Glycemic control and dietary habits - Hemoglobin A1c increased to 9.5 from previous range of 7, indicating worsening glycemic control. - Fatigue and decreased dietary discipline since 07-Jan-2025, following the death of her husband. - Currently taking metformin  1000 mg in the morning and 500 mg in the evening. - Working on lifestyle improvement by joining a gym and using a treadmill at home. - Dietary changes attributed to financial constraints and recent holiday sales.  Hyperlipidemia - Triglycerides elevated to 576, previously ranged from 200-400. - Continues atorvastatin  therapy for cholesterol management. - Dietary changes have contributed to worsening lipid profile.  Hypertension Controlled on current regimen.  PSTD History Traumatic Brain Injury Mood and psychological health - History of PTSD and mood concerns. - No current therapy or medication changes for mood or PTSD. - Supported by friends and her son, who recently moved in for companionship.  Preventive care and immunizations - Up to date on influenza and COVID vaccinations, received in October. - Received shingles vaccine last year. - Uncertain about last tetanus vaccination, recalls one from childhood after injury. - Considering pneumonia vaccine due to recent eligibility changes for those 50 and older.  Cancer screening hesitancy - Family history of cancer; husband passed away from multiple cancers originating in the liver and metastasizing. - Current  hesitancy towards cancer screenings, with plans to address this in the future.        Health Maintenance:   Last Colonoscopy 04/11/19, next due 10 year, 2030.       04/10/2024    9:17 AM 04/07/2023   10:15 AM 04/08/2021   10:49 AM  Depression screen PHQ 2/9  Decreased Interest 1 3   Down, Depressed, Hopeless 1 2   PHQ - 2 Score 2 5   Altered sleeping 1 3   Tired, decreased energy 0 3 1  Change in appetite 1 3 2   Feeling bad or failure about yourself  0 0 2  Trouble concentrating 0 2 2  Moving slowly or fidgety/restless 1 3 0  Suicidal thoughts 0 0 0  PHQ-9 Score 5 19       Data saved with a previous flowsheet row definition       04/10/2024    9:20 AM 04/07/2023   10:16 AM 04/08/2021   10:50 AM  GAD 7 : Generalized Anxiety Score  Nervous, Anxious, on Edge 1 2 3   Control/stop worrying 0 2 3  Worry too much - different things 0 3 3  Trouble relaxing 0 3 3  Restless 1 0 2  Easily annoyed or irritable 1 2 2   Afraid - awful might happen 0 2 3  Total GAD 7 Score 3 14 19      Past Medical History:  Diagnosis Date   Diabetes mellitus without complication (HCC)    Hypercholesteremia    Hypertension    Past Surgical History:  Procedure Laterality Date   GALLBLADDER SURGERY     TUBAL LIGATION     Social History   Socioeconomic  History   Marital status: Married    Spouse name: Not on file   Number of children: Not on file   Years of education: Not on file   Highest education level: Associate degree: occupational, scientist, product/process development, or vocational program  Occupational History   Not on file  Tobacco Use   Smoking status: Never   Smokeless tobacco: Never  Vaping Use   Vaping status: Never Used  Substance and Sexual Activity   Alcohol use: Yes    Comment: occasional   Drug use: No   Sexual activity: Yes    Birth control/protection: Surgical  Other Topics Concern   Not on file  Social History Narrative   Not on file   Social Drivers of Health   Tobacco  Use: Low Risk (04/10/2024)   Patient History    Smoking Tobacco Use: Never    Smokeless Tobacco Use: Never    Passive Exposure: Not on file  Financial Resource Strain: Low Risk (04/09/2024)   Overall Financial Resource Strain (CARDIA)    Difficulty of Paying Living Expenses: Not hard at all  Food Insecurity: No Food Insecurity (04/09/2024)   Epic    Worried About Radiation Protection Practitioner of Food in the Last Year: Never true    Ran Out of Food in the Last Year: Never true  Transportation Needs: No Transportation Needs (04/09/2024)   Epic    Lack of Transportation (Medical): No    Lack of Transportation (Non-Medical): No  Physical Activity: Sufficiently Active (04/09/2024)   Exercise Vital Sign    Days of Exercise per Week: 7 days    Minutes of Exercise per Session: 120 min  Stress: Stress Concern Present (04/09/2024)   [employer] of Occupational Health - Occupational Stress Questionnaire    Feeling of Stress: Very much  Social Connections: Socially Isolated (04/09/2024)   Social Connection and Isolation Panel    Frequency of Communication with Friends and Family: More than three times a week    Frequency of Social Gatherings with Friends and Family: Once a week    Attends Religious Services: Never    Database Administrator or Organizations: No    Attends Engineer, Structural: Not on file    Marital Status: Widowed  Intimate Partner Violence: Unknown (07/13/2021)   Received from [facility]   HITS    Physically Hurt: Not on file    Insult or Talk Down To: Not on file    Threaten Physical Harm: Not on file    Scream or Curse: Not on file  Depression (PHQ2-9): Medium Risk (04/10/2024)   Depression (PHQ2-9)    PHQ-2 Score: 5  Alcohol Screen: Low Risk (04/06/2023)   Alcohol Screen    Last Alcohol Screening Score (AUDIT): 1  Housing: Low Risk (04/09/2024)   Epic    Unable to Pay for Housing in the Last Year: No    Number of Times Moved in the Last Year: 0    Homeless in  the Last Year: No  Utilities: Not on file  Health Literacy: Low Risk (03/30/2021)   Received from [facility] Literacy    How often do you need to have someone help you when you read instructions, pamphlets, or other written material from your doctor or pharmacy?: Never   Family History  Problem Relation Age of Onset   Stroke Father 64   Diabetes Father    Cancer Maternal Aunt        [address]  Mental illness Maternal Grandmother    Current Outpatient Medications on File Prior to Visit  Medication Sig   atorvastatin  (LIPITOR) 10 MG tablet Take 1 tablet (10 mg total) by mouth daily.   diclofenac  Sodium (VOLTAREN ) 1 % GEL Apply 2 g topically 4 (four) times daily.   lidocaine (LIDODERM) 5 % Place onto the skin.   lisinopril -hydrochlorothiazide  (ZESTORETIC ) 20-12.5 MG tablet Take 1 tablet by mouth daily.   Menthol, Topical Analgesic, 10 % LIQD    metFORMIN  (GLUCOPHAGE ) 1000 MG tablet Take 1 tablet (1,000 mg total) by mouth daily with breakfast. Other order is for 500mg  in evening with meal.   metFORMIN  (GLUCOPHAGE ) 500 MG tablet Take 1 tablet (500 mg total) by mouth daily with supper. Note also taking 1000mg  in morning with breakfast   mometasone (NASONEX) 50 MCG/ACT nasal spray as needed.   Nerve Stimulator (STANDARD TENS) DEVI by Does not apply route.   No current facility-administered medications on file prior to visit.    Review of Systems  Constitutional:  Negative for activity change, appetite change, chills, diaphoresis, fatigue and fever.  HENT:  Negative for congestion and hearing loss.   Eyes:  Negative for visual disturbance.  Respiratory:  Negative for cough, chest tightness, shortness of breath and wheezing.   Cardiovascular:  Negative for chest pain, palpitations and leg swelling.  Gastrointestinal:  Negative for abdominal pain, constipation, diarrhea, nausea and vomiting.  Genitourinary:  Negative for dysuria, frequency and hematuria.  Musculoskeletal:   Negative for arthralgias and neck pain.  Skin:  Negative for rash.  Neurological:  Negative for dizziness, weakness, light-headedness, numbness and headaches.  Hematological:  Negative for adenopathy.  Psychiatric/Behavioral:  Negative for behavioral problems, dysphoric mood and sleep disturbance.    Per HPI unless specifically indicated above     Objective:    BP 120/72 (BP Location: Right Arm, Patient Position: Sitting, Cuff Size: Normal)   Pulse 94   Ht 5' 4 (1.626 m)   Wt 224 lb (101.6 kg)   LMP 02/28/2016   SpO2 96%   BMI 38.45 kg/m   Wt Readings from Last 3 Encounters:  04/10/24 224 lb (101.6 kg)  04/07/23 224 lb (101.6 kg)  05/27/22 242 lb (109.8 kg)    Physical Exam Vitals and nursing note reviewed.  Constitutional:      General: She is not in acute distress.    Appearance: She is well-developed. She is obese. She is not diaphoretic.     Comments: Well-appearing, comfortable, cooperative  HENT:     Head: Normocephalic and atraumatic.  Eyes:     General:        Right eye: No discharge.        Left eye: No discharge.     Conjunctiva/sclera: Conjunctivae normal.     Pupils: Pupils are equal, round, and reactive to light.  Neck:     Thyroid : No thyromegaly.  Cardiovascular:     Rate and Rhythm: Normal rate and regular rhythm.     Pulses: Normal pulses.     Heart sounds: Normal heart sounds. No murmur heard. Pulmonary:     Effort: Pulmonary effort is normal. No respiratory distress.     Breath sounds: Normal breath sounds. No wheezing or rales.  Abdominal:     General: Bowel sounds are normal. There is no distension.     Palpations: Abdomen is soft. There is no mass.     Tenderness: There is no abdominal tenderness.  Musculoskeletal:  General: No tenderness. Normal range of motion.     Cervical back: Normal range of motion and neck supple.     Right lower leg: No edema.     Left lower leg: No edema.     Comments: Upper / Lower Extremities: - Normal  muscle tone, strength bilateral upper extremities 5/5, lower extremities 5/5  Lymphadenopathy:     Cervical: No cervical adenopathy.  Skin:    General: Skin is warm and dry.     Findings: No erythema or rash.  Neurological:     Mental Status: She is alert and oriented to person, place, and time.     Comments: Distal sensation intact to light touch all extremities  Psychiatric:        Mood and Affect: Mood normal.        Behavior: Behavior normal.        Thought Content: Thought content normal.     Comments: Well groomed, good eye contact, normal speech and thoughts. Tearful episodes     Diabetic Foot Exam - Simple   Simple Foot Form Visual Inspection No deformities, no ulcerations, no other skin breakdown bilaterally: Yes Sensation Testing Intact to touch and monofilament testing bilaterally: Yes Pulse Check Posterior Tibialis and Dorsalis pulse intact bilaterally: Yes Comments    I have personally reviewed the radiology report from 04/11/23 on CT Coronary Calcium .  ADDENDUM REPORT: 04/17/2023 08:52   EXAM: OVER-READ INTERPRETATION CT CHEST   The following report is an over-read performed by radiologist Dr. Norman Hopper of [facility] on 04/17/2023. This over-read does not include interpretation of cardiac or coronary anatomy or pathology. The coronary calcium  score interpretation by the cardiologist is attached.   COMPARISON:  Chest radiograph dated 05/10/2016.   FINDINGS: Cardiovascular: Vascular calcifications are seen in the thoracic aorta. Normal heart size. No pericardial effusion.   Mediastinum/Nodes: No enlarged mediastinal lymph nodes. The visible trachea and esophagus demonstrate no significant findings.   Lungs/Pleura: The visible lungs are clear. No pleural effusion.   Upper Abdomen: No acute abnormality.   Musculoskeletal: Degenerative changes are seen in the spine.   IMPRESSION: 1. No acute findings in the chest.   Aortic  Atherosclerosis (ICD10-I70.0).     Electronically Signed   By: Norman Hopper M.D.   On: 04/17/2023 08:52    Addended by Hopper Norman SQUIBB, MD on 04/17/2023  8:55 AM    Study Result  Narrative & Impression  CLINICAL DATA:  Risk stratification   EXAM: Coronary Calcium  Score   TECHNIQUE: The patient was scanned on a Siemens Somatom scanner. Axial non-contrast 3 mm slices were carried out through the heart. The data set was analyzed on a dedicated work station and scored using the Agatson method.   FINDINGS: Non-cardiac: See separate report from [facility].   Ascending Aorta: Normal size   Pericardium: Normal   Coronary arteries: Normal origin of left and right coronary arteries. Distribution of arterial calcifications if present, as noted below;   LM 0   LAD 0   LCx 0   RCA 0   Total 0   IMPRESSION AND RECOMMENDATION: 1. Coronary calcium  score of 0.   2. CAC 0, CAC-DRS A0.   3. Continue heart healthy lifestyle and risk factor modification.   Electronically Signed: By: Redell Cave M.D. On: 04/11/2023 14:37     Results for orders placed or performed in visit on 03/27/24  TSH   Collection Time: 04/03/24  8:46 AM  Result Value Ref  Range   TSH 2.29 0.40 - 4.50 mIU/L  COMPLETE METABOLIC PANEL WITH GFR   Collection Time: 04/03/24  8:46 AM  Result Value Ref Range   Glucose, Bld 271 (H) 65 - 99 mg/dL   BUN 18 7 - 25 mg/dL   Creat 9.34 9.49 - 8.96 mg/dL   BUN/Creatinine Ratio SEE NOTE: 6 - 22 (calc)   Sodium 137 135 - 146 mmol/L   Potassium 4.0 3.5 - 5.3 mmol/L   Chloride 103 98 - 110 mmol/L   CO2 24 20 - 32 mmol/L   Calcium  10.5 (H) 8.6 - 10.4 mg/dL   Total Protein 6.7 6.1 - 8.1 g/dL   Albumin 4.6 3.6 - 5.1 g/dL   Globulin 2.1 1.9 - 3.7 g/dL (calc)   AG Ratio 2.2 1.0 - 2.5 (calc)   Total Bilirubin 0.5 0.2 - 1.2 mg/dL   Alkaline phosphatase (APISO) 68 37 - 153 U/L   AST 10 10 - 35 U/L   ALT 15 6 - 29 U/L  CBC with Differential/Platelet    Collection Time: 04/03/24  8:46 AM  Result Value Ref Range   WBC 9.6 3.8 - 10.8 Thousand/uL   RBC 4.71 3.80 - 5.10 Million/uL   Hemoglobin 14.8 11.7 - 15.5 g/dL   HCT 54.6 64.0 - 53.9 %   MCV 96.2 81.4 - 101.7 fL   MCH 31.4 27.0 - 33.0 pg   MCHC 32.7 31.6 - 35.4 g/dL   RDW 87.9 88.9 - 84.9 %   Platelets 319 140 - 400 Thousand/uL   MPV 10.5 7.5 - 12.5 fL   Neutro Abs 5,395 1,500 - 7,800 cells/uL   Absolute Lymphocytes 3,322 850 - 3,900 cells/uL   Absolute Monocytes 643 200 - 950 cells/uL   Eosinophils Absolute 173 15 - 500 cells/uL   Basophils Absolute 67 0 - 200 cells/uL   Neutrophils Relative % 56.2 %   Total Lymphocyte 34.6 %   Monocytes Relative 6.7 %   Eosinophils Relative 1.8 %   Basophils Relative 0.7 %  Hemoglobin A1c   Collection Time: 04/03/24  8:46 AM  Result Value Ref Range   Hgb A1c MFr Bld 9.5 (H) <5.7 %   Mean Plasma Glucose 226 mg/dL   eAG (mmol/L) 87.4 mmol/L  Lipid panel   Collection Time: 04/03/24  8:46 AM  Result Value Ref Range   Cholesterol 191 <200 mg/dL   HDL 35 (L) > OR = 50 mg/dL   Triglycerides 423 (H) <150 mg/dL   LDL Cholesterol (Calc)  mg/dL (calc)   Total CHOL/HDL Ratio 5.5 (H) <5.0 (calc)   Non-HDL Cholesterol (Calc) 156 (H) <130 mg/dL (calc)  Microalbumin / creatinine urine ratio   Collection Time: 04/03/24  8:53 AM  Result Value Ref Range   Creatinine, Urine 107 20 - 275 mg/dL   Microalb, Ur 3.0 mg/dL   Microalb Creat Ratio 28 <30 mg/g creat      Assessment & Plan:   Problem List Items Addressed This Visit     History of traumatic brain injury   Hyperlipidemia associated with type 2 diabetes mellitus (HCC)   Morbid obesity (HCC)   Post concussive syndrome   PTSD (post-traumatic stress disorder)   Type 2 diabetes mellitus with other specified complication (HCC)   Other Visit Diagnoses       Annual physical exam    -  Primary     Need for Streptococcus pneumoniae vaccination       Relevant Orders   Pneumococcal conjugate  vaccine 20-valent (Completed)     Encounter for screening mammogram for malignant neoplasm of breast       Relevant Orders   MM 3D SCREENING MAMMOGRAM BILATERAL BREAST     Cervical cancer screening       Relevant Orders   Ambulatory referral to Obstetrics / Gynecology     Essential hypertension         Long term current use of oral hypoglycemic drug            Updated Health Maintenance information Reviewed recent lab results with patient Encouraged improvement to lifestyle with diet and exercise Goal of weight loss   Annual physical examination conducted. Vaccinations and cancer screenings discussed. She is up to date on flu and COVID vaccines. Shingles vaccine received last year. Pneumonia vaccine recommended due to new guidelines for those aged 64 and older. Mammogram and Pap smear screenings discussed, with plans to schedule in the future. Colon cancer screening is up to date until 2030. - Administered Prevnar 20 vaccine today. - Ordered mammogram with standing order for one year. - Ordered Pap smear with standing order for one year. - Continue colon cancer screening as scheduled until 2030.  Type 2 diabetes mellitus with hypertriglyceridemia A1c increased to 9.5 from previous levels in the 7 range. Triglycerides elevated at 576, likely due to recent lifestyle changes following husband's passing. LDL cholesterol calculation inaccurate due to high triglycerides. She is on metformin  and atorvastatin . She has joined a gym and is engaging in lifestyle changes to manage diabetes and cholesterol. - Continue metformin  1000 mg in the morning and 500 mg in the evening. - Continue atorvastatin . - Encouraged lifestyle modifications including exercise and dietary changes. - Scheduled follow-up blood tests in six months to reassess A1c and triglycerides.  Essential hypertension Blood pressure is well-controlled at 120/72 mmHg. On Lisinopril -hydrochlorothiazide  20-12.5mg  daily  Morbid  Obesity  BMI >38 Comorbid diabetes, hypertension Encourage lifestyle modification management Consider medication options Currently pursuing gym and exercise regimen goals  Post-traumatic stress disorder (PTSD) Chronic PTSD  / Post Concussive Syndrome following traumatic brain injury history Worsening lately w/ recent loss of husband. She is experiencing fatigue and emotional distress. She has support from family and hospice counseling. She is not currently interested in therapy or medication management but is open to future support if needed. - Offered referral to mental health provider or case manager if needed in the future.          Orders Placed This Encounter  Procedures   MM 3D SCREENING MAMMOGRAM BILATERAL BREAST    Standing Status:   Future    Expiration Date:   04/10/2025    Reason for Exam (SYMPTOM  OR DIAGNOSIS REQUIRED):   Screening bilateral 3D Mammogram Tomo    Preferred imaging location?:   [facility]   Pneumococcal conjugate vaccine 20-valent   Ambulatory referral to Obstetrics / Gynecology    Referral Priority:   Routine    Referral Type:   Consultation    Referral Reason:   Specialty Services Required    Requested Specialty:   Obstetrics and Gynecology    Number of Visits Requested:   1    No orders of the defined types were placed in this encounter.    Follow up plan: Return for 6 month fasting lab > 1 week later Follow-up Lab results DM, Cholesterol, Mood.  Future check immunity on Hep B, add A1c 6 months Direct LDL /TG  Marsa Officer, DO [facility]  [facility] 04/10/2024, 9:41 AM  "

## 2024-04-10 NOTE — Patient Instructions (Addendum)
 Thank you for coming to the office today.  For Mammogram screening for breast cancer   Call the Imaging Center below anytime to schedule your own appointment now that order has been placed.  [facility] at [facility] [address], [address] [place], [place] [address] Phone: [phone]  ------------------------  Referral to GYN for pap smear  OB / GYN  [facility] at [facility] [address] [place],  [place]  [address] Main: [phone] Fax: [phone]  -----------  No colonoscopy for another 5 years, 2030  --------------  Elevated Triglyceride, and A1c  Recent Labs    04/03/24 0846  HGBA1C 9.5*   Keep on current treatment plan with medications and continue to improve exercise and lifestyle.    Please schedule a Follow-up Appointment to: Return for 6 month fasting lab > 1 week later Follow-up Lab results DM, Cholesterol, Mood.  If you have any other questions or concerns, please feel free to call the office or send a message through MyChart. You may also schedule an earlier appointment if necessary.  Additionally, you may be receiving a survey about your experience at our office within a few days to 1 week by e-mail or mail. We value your feedback.  Marsa Officer, DO [facility], [place]

## 2024-10-02 ENCOUNTER — Other Ambulatory Visit

## 2024-10-09 ENCOUNTER — Ambulatory Visit: Admitting: Family Medicine
